# Patient Record
Sex: Male | Born: 1950
Health system: Southern US, Community
[De-identification: ages and names within clinical notes are randomized; demographics above are authoritative.]

## PROBLEM LIST (undated history)

## (undated) DIAGNOSIS — I10 Essential (primary) hypertension: Secondary | ICD-10-CM

## (undated) DIAGNOSIS — M1990 Unspecified osteoarthritis, unspecified site: Secondary | ICD-10-CM

## (undated) DIAGNOSIS — R112 Nausea with vomiting, unspecified: Secondary | ICD-10-CM

## (undated) DIAGNOSIS — K219 Gastro-esophageal reflux disease without esophagitis: Secondary | ICD-10-CM

## (undated) DIAGNOSIS — Z9889 Other specified postprocedural states: Secondary | ICD-10-CM

## (undated) DIAGNOSIS — N200 Calculus of kidney: Secondary | ICD-10-CM

## (undated) DIAGNOSIS — E7800 Pure hypercholesterolemia, unspecified: Secondary | ICD-10-CM

## (undated) DIAGNOSIS — I2510 Atherosclerotic heart disease of native coronary artery without angina pectoris: Secondary | ICD-10-CM

## (undated) DIAGNOSIS — C801 Malignant (primary) neoplasm, unspecified: Secondary | ICD-10-CM

## (undated) DIAGNOSIS — Z8719 Personal history of other diseases of the digestive system: Secondary | ICD-10-CM

## (undated) HISTORY — PX: OTHER SURGICAL HISTORY: SHX169

## (undated) HISTORY — PX: LITHOTRIPSY: SUR834

## (undated) HISTORY — PX: CORONARY ANGIOPLASTY: SHX604

## (undated) HISTORY — PX: CARDIAC CATHETERIZATION: SHX172

## (undated) HISTORY — PX: HERNIA REPAIR: SHX51

---

## 1972-02-23 HISTORY — PX: FRACTURE SURGERY: SHX138

## 1997-07-08 ENCOUNTER — Observation Stay (HOSPITAL_COMMUNITY): Admission: AD | Admit: 1997-07-08 | Discharge: 1997-07-09 | Payer: Self-pay | Admitting: Cardiology

## 2001-05-18 ENCOUNTER — Ambulatory Visit (HOSPITAL_BASED_OUTPATIENT_CLINIC_OR_DEPARTMENT_OTHER): Admission: RE | Admit: 2001-05-18 | Discharge: 2001-05-18 | Payer: Self-pay | Admitting: Urology

## 2001-05-18 ENCOUNTER — Encounter: Payer: Self-pay | Admitting: Urology

## 2001-07-03 ENCOUNTER — Ambulatory Visit (HOSPITAL_BASED_OUTPATIENT_CLINIC_OR_DEPARTMENT_OTHER): Admission: RE | Admit: 2001-07-03 | Discharge: 2001-07-03 | Payer: Self-pay | Admitting: Urology

## 2004-02-19 ENCOUNTER — Encounter: Payer: Self-pay | Admitting: Cardiology

## 2005-06-17 ENCOUNTER — Encounter: Payer: Self-pay | Admitting: Cardiology

## 2010-01-19 ENCOUNTER — Ambulatory Visit: Payer: Self-pay | Admitting: Cardiology

## 2010-07-10 NOTE — Op Note (Signed)
[facility]  Patient:    Lucas Buckley, Lucas Buckley Visit Number: [id number] MRN: [id number]          Service Type: NES Location: [facility] Attending Physician:  Trisha Mangle Dictated by:   Veverly Fells Vernie Ammons, M.D. Proc. Date: 07/03/01 Admit Date:  07/03/2001                             Operative Report  PREOPERATIVE DIAGNOSIS:  Left distal ureteral calculus.  POSTOPERATIVE DIAGNOSIS:  Left distal ureteral calculus.  PROCEDURE:  Cystoscopy, left ureteroscopy, and laser lithotripsy and stone extraction with double-J stent.  SURGEON:  Mark C. Vernie Ammons, M.D.  ANESTHESIA:  General.  DRAINS:  4.5 French, 26 cm double-J stent in left ureter.  SPECIMENS:  Stone to patient.  ESTIMATED BLOOD LOSS:  Approximately 10 cc.  COMPLICATIONS:  None.  INDICATIONS:  The patient is a 60 year old white male, who had a large distal ureteral stone, measuring 14 x 7 mm.  He underwent lithotripsy of the stone and had no significant fragmentation.  He therefore is brought to the OR today for ureteroscopic extraction of the stone.  He understands the risks, complications, alternatives, and limitations.  DESCRIPTION OF PROCEDURE:  After informed consent, the patient brought to the major OR, placed on the table, administered general anesthesia, then moved to the dorsal lithotomy position.  His genitalia was sterilely prepped and draped, and 6 French rigid ureteroscope was then passed per urethra into the bladder.  The bladder was noted to free of any tumor, stones, or inflammatory lesions on brief exam.  The left ureteral orifice was identified, and the scope was advanced into the orifice without difficulty and the stone identified and photographed.  I then felt because of the stones large size, it needed to be fragmented first and used the holmium laser to fragment the stone at a rate of 8 hertz and a power of .8 joules.  The stone was found to be extremely hard and fragmented  slowly but eventually did break into multiple pieces.  I then attempted to extract the pieces but noted some resistance, so I left the pieces in place, passed a guidewire through the scope and then used the ureteral dilating balloon and dilated the ureter to 18 atmospheres for two minutes.  Then I was able to reinsert the ureteroscope and extract multiple fragments.  This left only just a few small fragments present, and I therefore reinserted the ureteroscope, checked the ureter and found no other large fragm,ents and then passed the guidewire through the ureteroscope and removed the ureteroscope and backloaded the cystoscope and passed the double-J stent. Good curl was noted in the bladder and kidney, and the bladder was then drained, and the patient was awakened and taken to the recovery room in stable and satisfactory condition.  He will be given a prescription for Tylox for pain, #20, and follow up in my office in one week for stent removal.  A string was left affixed to the end of the stent. Dictated by:   Veverly Fells Vernie Ammons, M.D. Attending Physician:  Trisha Mangle DD:  07/03/01 TD:  07/03/01 Job: [id number]

## 2011-01-19 ENCOUNTER — Telehealth: Payer: Self-pay | Admitting: Cardiology

## 2011-01-19 NOTE — Telephone Encounter (Signed)
Pt to have a biopsy, pt needs to be off asa for five days prior

## 2011-01-19 NOTE — Telephone Encounter (Signed)
Left message

## 2011-01-19 NOTE — Telephone Encounter (Signed)
Is this ok?

## 2011-01-19 NOTE — Telephone Encounter (Signed)
Okay to hold the aspirin 5 days

## 2011-01-20 ENCOUNTER — Telehealth: Payer: Self-pay | Admitting: Cardiology

## 2011-01-20 DIAGNOSIS — E7800 Pure hypercholesterolemia, unspecified: Secondary | ICD-10-CM

## 2011-01-20 NOTE — Telephone Encounter (Signed)
Advised ok  

## 2011-01-20 NOTE — Telephone Encounter (Signed)
New Msg: Pt calling to schedule appt w/ Dr. Patty Sermons and wanted to know if and when pt needed to have blood work prior to pt appt. Please return pt call to discuss further.

## 2011-01-20 NOTE — Telephone Encounter (Signed)
Scheduled for labs and orders put in system

## 2011-01-25 ENCOUNTER — Other Ambulatory Visit (INDEPENDENT_AMBULATORY_CARE_PROVIDER_SITE_OTHER): Payer: 59 | Admitting: *Deleted

## 2011-01-25 DIAGNOSIS — E7800 Pure hypercholesterolemia, unspecified: Secondary | ICD-10-CM

## 2011-01-25 LAB — BASIC METABOLIC PANEL
BUN: 14 mg/dL (ref 6–23)
CO2: 25 mEq/L (ref 19–32)
Calcium: 9.1 mg/dL (ref 8.4–10.5)
Chloride: 105 mEq/L (ref 96–112)
Creatinine, Ser: 1 mg/dL (ref 0.4–1.5)
GFR: 83.62 mL/min (ref >60.00)
Glucose, Bld: 111 mg/dL — ABNORMAL HIGH (ref 70–99)
Potassium: 4 mEq/L (ref 3.5–5.1)
Sodium: 139 mEq/L (ref 135–145)

## 2011-01-25 LAB — LIPID PANEL
Cholesterol: 155 mg/dL (ref 0–200)
HDL: 42.4 mg/dL (ref >39.00)
LDL Cholesterol: 98 mg/dL (ref 0–99)
Total CHOL/HDL Ratio: 4
Triglycerides: 74 mg/dL (ref 0.0–149.0)
VLDL: 14.8 mg/dL (ref 0.0–40.0)

## 2011-01-25 LAB — HEPATIC FUNCTION PANEL
ALT: 32 U/L (ref 0–53)
AST: 38 U/L — ABNORMAL HIGH (ref 0–37)
Albumin: 4.2 g/dL (ref 3.5–5.2)
Alkaline Phosphatase: 46 U/L (ref 39–117)
Bilirubin, Direct: 0.2 mg/dL (ref 0.0–0.3)
Total Bilirubin: 1.6 mg/dL — ABNORMAL HIGH (ref 0.3–1.2)
Total Protein: 7.5 g/dL (ref 6.0–8.3)

## 2011-01-27 ENCOUNTER — Ambulatory Visit (INDEPENDENT_AMBULATORY_CARE_PROVIDER_SITE_OTHER): Payer: 59 | Admitting: Cardiology

## 2011-01-27 ENCOUNTER — Encounter: Payer: Self-pay | Admitting: Cardiology

## 2011-01-27 VITALS — BP 138/80 | HR 60 | Ht 73.0 in | Wt 259.0 lb

## 2011-01-27 DIAGNOSIS — I259 Chronic ischemic heart disease, unspecified: Secondary | ICD-10-CM

## 2011-01-27 DIAGNOSIS — I421 Obstructive hypertrophic cardiomyopathy: Secondary | ICD-10-CM

## 2011-01-27 DIAGNOSIS — E7800 Pure hypercholesterolemia, unspecified: Secondary | ICD-10-CM | POA: Insufficient documentation

## 2011-01-27 NOTE — Patient Instructions (Signed)
Your physician recommends that you continue on your current medications as directed. Please refer to the Current Medication list given to you today.  Your physician wants you to follow-up in: 6 months. You will receive a reminder letter in the mail two months in advance. If you don't receive a letter, please call our office to schedule the follow-up appointment.  

## 2011-01-27 NOTE — Assessment & Plan Note (Signed)
His lipids are satisfactory on current Crestor therapy.  As lost 10 pounds since last visit.  We will try to get his LDL below her to continued careful diet and continue Crestor

## 2011-01-27 NOTE — Assessment & Plan Note (Signed)
The patient is having no symptoms related to his IHSS.  No dizziness or syncope chest pain or dyspnea

## 2011-01-27 NOTE — Assessment & Plan Note (Signed)
Patient goes to the gymnasium on a regular basis.  He has not been experiencing any exertional chest pain.  His electrocardiogram today shows improvement since 2003.

## 2011-01-27 NOTE — Progress Notes (Signed)
Lucas Buckley Date of Birth:  [birth year] [facility] [address]   [address] [place], [place]  [address] [phone]           Fax   [phone]  History of Present Illness: This pleasant 60 year old gentleman is seen for a scheduled followup office visit.  He has a known history of ischemic heart disease.  In 1999 he had stents to his right coronary artery and circumflex.  Dr. [place] performed the catheterization.  The patient has not had any recurrent chest pain.  He had a normal 2 day stress Cardiolite study in 2005.  Ejection fraction was 56%.  The patient has a history of IHSS.  He also has a history of hypercholesterolemia.  Recently had an abnormal rectal exam by his urologist and is scheduled to have a transrectal biopsy in next week.  The patient has a history of probable bursitis of the shoulders and has seen Dr. Darrelyn Hillock.  Current Outpatient Prescriptions  Medication Sig Dispense Refill  . ascorbic acid (VITAMIN C) 500 MG tablet Take 500 mg by mouth daily.        Marland Kitchen aspirin 325 MG tablet Take 325 mg by mouth daily.        Marland Kitchen GARLIC PO Take by mouth daily.        . metoprolol (TOPROL-XL) 100 MG 24 hr tablet Take 100 mg by mouth daily.        . Omega-3 Fatty Acids (FISH OIL PO) Take by mouth. Taking 1000 daily       . rosuvastatin (CRESTOR) 10 MG tablet Take 10 mg by mouth daily.        . Saw Palmetto, Serenoa repens, (SAW PALMETTO PO) Take by mouth.          Not on File  There is no problem list on file for this patient.   History  Smoking status  . Never Smoker   Smokeless tobacco  . Not on file    History  Alcohol Use: Not on file    No family history on file.  Review of Systems: Constitutional: no fever chills diaphoresis or fatigue or change in weight.  Head and neck: no hearing loss, no epistaxis, no photophobia or visual disturbance. Respiratory: No cough, shortness of breath or wheezing. Cardiovascular: No chest pain peripheral  edema, palpitations. Gastrointestinal: No abdominal distention, no abdominal pain, no change in bowel habits hematochezia or melena. Genitourinary: No dysuria, no frequency, no urgency, no nocturia. Musculoskeletal:No arthralgias, no back pain, no gait disturbance or myalgias. Neurological: No dizziness, no headaches, no numbness, no seizures, no syncope, no weakness, no tremors. Hematologic: No lymphadenopathy, no easy bruising. Psychiatric: No confusion, no hallucinations, no sleep disturbance.    Physical Exam: Filed Vitals:   01/27/11 1328  BP: 138/80  Pulse: 60   the general appearance reveals a well-developed well-nourished gentleman in no distress.Pupils equal and reactive.   Extraocular Movements are full.  There is no scleral icterus.  The mouth and pharynx are normal.  The neck is supple.  The carotids reveal no bruits.  The jugular venous pressure is normal.  The thyroid is not enlarged.  There is no lymphadenopathy.  The chest is clear to percussion and auscultation. There are no rales or rhonchi. Expansion of the chest is symmetrical.  Heart reveals a soft systolic ejection murmur at the base.The abdomen is soft and nontender. Bowel sounds are normal. The liver and spleen are not enlarged. There Are no  abdominal masses. There are no bruits.  Extremities no phlebitis or edema.  Normal pedal pulsesThe skin is warm and dry.  There is no rash. Strength is normal and symmetrical in all extremities.  There is no lateralizing weakness.  There are no sensory deficits.  EKG shows sinus bradycardia and minor nonspecific T wave abnormalities, improved since 2003.   Assessment / Plan:  Continue same medication.  He will be having a prostate biopsy next week.  Recheck in 6 months for followup office visit lipid panel and hepatic function panel and basal metabolic panel

## 2011-02-19 ENCOUNTER — Other Ambulatory Visit: Payer: Self-pay | Admitting: *Deleted

## 2011-02-19 MED ORDER — METOPROLOL SUCCINATE ER 100 MG PO TB24: 100.0000 mg | ORAL_TABLET | Freq: Every day | ORAL | Status: DC

## 2011-03-01 ENCOUNTER — Other Ambulatory Visit: Payer: Self-pay | Admitting: *Deleted

## 2011-03-01 MED ORDER — METOPROLOL SUCCINATE ER 100 MG PO TB24: 100.0000 mg | ORAL_TABLET | Freq: Every day | ORAL | Status: DC

## 2011-03-01 NOTE — Telephone Encounter (Signed)
Refilled metoprolol 

## 2011-03-12 ENCOUNTER — Other Ambulatory Visit: Payer: Self-pay | Admitting: Urology

## 2011-03-15 ENCOUNTER — Other Ambulatory Visit: Payer: Self-pay | Admitting: *Deleted

## 2011-03-15 MED ORDER — ROSUVASTATIN CALCIUM 10 MG PO TABS: 10.0000 mg | ORAL_TABLET | Freq: Every day | ORAL | Status: DC

## 2011-03-15 NOTE — Telephone Encounter (Signed)
Refilled crestor 

## 2011-03-16 ENCOUNTER — Encounter (HOSPITAL_COMMUNITY): Payer: Self-pay | Admitting: Pharmacy Technician

## 2011-03-23 ENCOUNTER — Encounter (HOSPITAL_COMMUNITY): Payer: Self-pay

## 2011-03-23 ENCOUNTER — Encounter (HOSPITAL_COMMUNITY)
Admission: RE | Admit: 2011-03-23 | Discharge: 2011-03-23 | Disposition: A | Discharge status: Home / Self Care | Payer: 59 | Source: Ambulatory Visit | Attending: Urology | Admitting: Urology

## 2011-03-23 ENCOUNTER — Ambulatory Visit (HOSPITAL_COMMUNITY)
Admission: RE | Admit: 2011-03-23 | Discharge: 2011-03-23 | Disposition: A | Discharge status: Home / Self Care | Payer: 59 | Source: Ambulatory Visit | Attending: Urology | Admitting: Urology

## 2011-03-23 DIAGNOSIS — Z01818 Encounter for other preprocedural examination: Secondary | ICD-10-CM | POA: Insufficient documentation

## 2011-03-23 DIAGNOSIS — I259 Chronic ischemic heart disease, unspecified: Secondary | ICD-10-CM | POA: Insufficient documentation

## 2011-03-23 DIAGNOSIS — Z01812 Encounter for preprocedural laboratory examination: Secondary | ICD-10-CM | POA: Insufficient documentation

## 2011-03-23 HISTORY — DX: Gastro-esophageal reflux disease without esophagitis: K21.9

## 2011-03-23 HISTORY — DX: Other specified postprocedural states: R11.2

## 2011-03-23 HISTORY — DX: Other specified postprocedural states: Z98.890

## 2011-03-23 HISTORY — DX: Unspecified osteoarthritis, unspecified site: M19.90

## 2011-03-23 HISTORY — DX: Atherosclerotic heart disease of native coronary artery without angina pectoris: I25.10

## 2011-03-23 HISTORY — DX: Pure hypercholesterolemia, unspecified: E78.00

## 2011-03-23 HISTORY — DX: Malignant (primary) neoplasm, unspecified: C80.1

## 2011-03-23 LAB — BASIC METABOLIC PANEL
CO2: 27 mEq/L (ref 19–32)
Chloride: 104 mEq/L (ref 96–112)
GFR calc Af Amer: >90 mL/min (ref >90)
Potassium: 4.8 mEq/L (ref 3.5–5.1)
Sodium: 140 mEq/L (ref 135–145)

## 2011-03-23 LAB — SURGICAL PCR SCREEN: Staphylococcus aureus: NEGATIVE

## 2011-03-23 LAB — CBC
Platelets: 174 10*3/uL (ref 150–400)
RBC: 4.96 MIL/uL (ref 4.22–5.81)
RDW: 12.9 % (ref 11.5–15.5)
WBC: 6.8 10*3/uL (ref 4.0–10.5)

## 2011-03-23 NOTE — Pre-Procedure Instructions (Signed)
States has bowel prep and instructions.   PER DR [name] OFFICE NOTE----LAST STRESS TEST 2005

## 2011-03-23 NOTE — Patient Instructions (Signed)
20 Lucas Buckley  03/23/2011   Your procedure is scheduled on:  03/29/11  Surgery   [id number]    Monday  Report to [facility] at 1215PM.  Call this number if you have problems the morning of surgery: [phone]       [facility]  PST [id number]   Remember:   Do not eat food:After Midnight. Saturday night  May have clear liquids:up to 6 Hours before arrival.until 0615 am  Monday       Increase fluids Sunday and drink until Monday AM at 0615  Clear liquids include soda, tea, black coffee, apple or grape juice, broth.  Take these medicines the morning of surgery with A SIP OF WATER: Metoprolol with sip water   Do not wear jewelry, make-up or nail polish.  Do not wear lotions, powders, or perfumes. You may wear deodorant.  Do not shave 48 hours prior to surgery.  Do not bring valuables to the hospital.  Contacts, dentures or bridgework may not be worn into surgery.  Leave suitcase in the car. After surgery it may be brought to your room.  For patients admitted to the hospital, checkout time is 11:00 AM the day of discharge.   Patients discharged the day of surgery will not be allowed to drive home.  Name and phone number of your driver: wife Special Instructions: CHG Shower Use Special Wash: 1/2 bottle night before surgery and 1/2 bottle morning of surgery.  Regular soap face and privates   Please read over the following fact sheets that you were given: MRSA Information

## 2011-03-26 NOTE — Pre-Procedure Instructions (Signed)
Left voice mail message of surgery time change on Monday at home and cell- instructed to appive at short stay at 1300

## 2011-03-28 NOTE — H&P (Signed)
Chief Complaint  Prostate Cancer   Reason For Visit  Reason for consult: To discuss treatment options for prostate cancer and specifically to consider a robotic prostatectomy. Physician requesting consult: Dr. Ihor Gully PCP: Dr. Elias Else Cardiologist: Dr. Cassell Clement   History of Present Illness  Lucas Buckley is a 61 year old who was found to have a new left apical prostate nodule by Dr. Vernie Ammons prompting a prostate biopsy on 01/23/11 which revealed Gleason 4+3=7 adenocarcinoma with 4 out of 12 biopsy cores positive for malignancy. His last PSA was 2.47 prior to his biopsy. He has no family history of prostate cancer.  He is very well informed about his treatment options and is most interested in proceeding with surgical therapy.  TNM stage: cT2a Nx Mx (L apex) PSA: 2.47 Gleason score: 4+3=7 Biopsy (02/02/11): 4/12 cores -- L lateral apex (5%, 3+3=6), L mid (60%, 4+3=7), L lateral base (20%, 4+3=7), L base (60%, 4+3=7, PNI) Prostate volume: 42.2 cc  Nomogram: OC disease: 75% EPE: 33% SVI: 1% LNI: 2% PFS (surgery): 94%, 92%  Urinary function: He has minimal voiding symptoms. IPSS: One. He does have some postvoid dribbling which is his most bothersome symptom. Erectile function: He does have mild erectile dysfunction which has been adequately treated with Levitra 10 mg. SHIM score without Levitra is 17.   Past Medical History Problems  1. History of  Coronary Artery Disease V12.59 2. History of  Idiopathic Hypertrophic Subaortic Stenosis 425.11 3. History of  Nephrolithiasis V13.01 4. History of  Testicle Torsion 608.20  Surgical History Problems  1. History of  Biopsy Of The Prostate Needle 2. History of  Cystoscopy With Ureteroscopy With Lithotripsy Left 3. History of  Leg Repair 4. History of  Lithotripsy Left 5. History of  Lithotripsy Left 6. History of  Surgery Testis Orchiopexy Bilateral 7. History of  Umbilical Hernia Repair  Current Meds 1. Aspirin TABS;  325mg ; 1 per day; Therapy: (Recorded:27Nov2012) to 2. Crestor TABS; Therapy: (Recorded:27Nov2012) to 3. Levitra 10 MG Oral Tablet; Take 1 tablet as needed; Therapy: 12Mar2010 to (Last Rx:27Nov2012)   Requested for: 27Nov2012 4. Metoprolol Succinate ER TB24; Therapy: (Recorded:27Nov2012) to  Allergies Medication  1. No Known Drug Allergies  Family History Problems  1. Paternal history of  Death In The Family Father age 41; heart problems Denied  2. Family history of  Prostate Cancer  Social History Problems  1. Activities Of Daily Living 2. Exercise Habits He was regularly visiting the wife for exercise doing primarily cardio and light weights at least 3-4 times a week and has had decreased his exercise regimen but is hoping to return to this level of activity. 3. Living Independently With Spouse 4. Marital History - Currently Married 5. Never A Smoker 6. Occupation: minister 7. Self-reliant In Usual Daily Activities Denied  8. History of  Alcohol Use 9. History of  Tobacco Use  Review of Systems Constitutional, skin, eye, otolaryngeal, hematologic/lymphatic, cardiovascular, pulmonary, endocrine, musculoskeletal, gastrointestinal, neurological and psychiatric system(s) were reviewed and pertinent findings if present are noted.  Cardiovascular: no chest pain and no leg swelling.  Respiratory: no shortness of breath and no cough.    Vitals Vital Signs [Data Includes: Last 1 Day]  29Jan2013 08:14AM  BMI Calculated: 34.46 BSA Calculated: 2.4 Height: 6 ft 1 in Weight: 260 lb  Blood Pressure: 163 / 80 Temperature: 97.6 F Heart Rate: 55  Physical Exam Constitutional: Well nourished and well developed . No acute distress.  ENT:. The ears and  nose are normal in appearance.  Neck: The appearance of the neck is normal and no neck mass is present.  Pulmonary: No respiratory distress, normal respiratory rhythm and effort and clear bilateral breath sounds.  Cardiovascular: Heart  rate and rhythm are normal . No peripheral edema.  Abdomen: The abdomen is rounded. The abdomen is soft and nontender. No masses are palpated. No CVA tenderness. No hernias are palpable. No hepatosplenomegaly noted.  Rectal: He has a distinct nodule at the left apex which is prominent and concerning for malignancy. This does extend to but not necessarily be on the capsule near the left apex.  Lymphatics: The femoral and inguinal nodes are not enlarged or tender.  Skin: Normal skin turgor, no visible rash and no visible skin lesions.  Neuro/Psych:. Mood and affect are appropriate.    Results/Data Urine [Data Includes: Last 1 Day]   29Jan2013 COLOR YELLOW  APPEARANCE CLEAR  SPECIFIC GRAVITY 1.025  pH 5.0  GLUCOSE NEG mg/dL BILIRUBIN NEG  KETONE NEG mg/dL BLOOD SMALL  PROTEIN NEG mg/dL UROBILINOGEN 0.2 mg/dL NITRITE NEG  LEUKOCYTE ESTERASE NEG  SQUAMOUS EPITHELIAL/HPF RARE  WBC 0-3 WBC/hpf RBC 0-3 RBC/hpf BACTERIA NONE SEEN  CRYSTALS NONE SEEN  CASTS NONE SEEN    I have independently reviewed his medical records, PSA results, and pathology report with findings as dictated above.  He also has been recently evaluated by Dr. Patty Sermons who has indicated that his IHSS is stable as well as his history of ischemic heart disease. He feels that he would be at low risk for proceeding with surgical treatment of his prostate cancer.   Assessment Assessed  1. Adenocarcinoma Of The Prostate Gland 185  Plan Adenocarcinoma Of The Prostate Gland (185)  1. Follow-up Office  Follow-up  Requested for: 29Jan2013 Health Maintenance (V70.0)  2. UA With REFLEX  Done: 29Jan2013 08:09AM  Discussion/Summary  1. Prostate cancer:   The patient was counseled about the natural history of prostate cancer and the standard treatment options that are available for prostate cancer. It was explained to him how his age and life expectancy, clinical stage, Gleason score, and PSA affect his prognosis, the  decision to proceed with additional staging studies, as well as how that information influences recommended treatment strategies. We discussed the roles for active surveillance, radiation therapy, surgical therapy, androgen deprivation, as well as ablative therapy options for the treatment of prostate cancer as appropriate to his individual cancer situation. We discussed the risks and benefits of these options with regard to their impact on cancer control and also in terms of potential adverse events, complications, and impact on quiality of life particularly related to urinary, bowel, and sexual function. The patient was encouraged to ask questions throughout the discussion today and all questions were answered to his stated satisfaction. In addition, the patient was provided with and/or directed to appropriate resources and literature for further education about prostate cancer and treatment options.   We discussed surgical therapy for prostate cancer including the different available surgical approaches. We discussed, in detail, the risks and expectations of surgery with regard to cancer control, urinary control, and erectile function as well as the expected postoperative recovery process. Additional risks of surgery including but not limited to bleeding, infection, hernia formation, nerve damage, lymphocele formation, bowel/rectal injury potentially necessitating colostomy, damage to the urinary tract resulting in urine leakage, urethral stricture, and the cardiopulmonary risks such as myocardial infarction, stroke, death, venothromboembolism, etc. were explained. The risk of open surgical conversion for robotic/laparoscopic  prostatectomy was also discussed.     He has been offered a radiation oncology consultation but does wish to proceed with surgical treatment of his prostate cancer. We have discussed proceeding with a right nerve sparing robotic prostatectomy and pelvic lymphadenectomy. He understands the  potential impact of a unilateral nerve sparing on his ultimate erectile function outcome. He is felt to be at low risk from a cardiac standpoint.  Cc: Dr. Elias Else Dr. Cassell Clement Dr. Ihor Gully    SignaturesElectronically signed by : Heloise Purpura, M.D.; Mar 23 2011  9:22AM

## 2011-03-29 ENCOUNTER — Encounter (HOSPITAL_COMMUNITY): Payer: Self-pay | Admitting: Certified Registered Nurse Anesthetist

## 2011-03-29 ENCOUNTER — Other Ambulatory Visit: Payer: Self-pay | Admitting: Urology

## 2011-03-29 ENCOUNTER — Inpatient Hospital Stay (HOSPITAL_COMMUNITY)
Admission: RE | Admit: 2011-03-29 | Discharge: 2011-03-30 | DRG: 708 | Disposition: A | Discharge status: Home / Self Care | Payer: 59 | Source: Ambulatory Visit | Attending: Urology | Admitting: Urology

## 2011-03-29 ENCOUNTER — Inpatient Hospital Stay (HOSPITAL_COMMUNITY): Payer: 59 | Admitting: Certified Registered Nurse Anesthetist

## 2011-03-29 ENCOUNTER — Encounter (HOSPITAL_COMMUNITY): Payer: Self-pay | Admitting: *Deleted

## 2011-03-29 ENCOUNTER — Encounter (HOSPITAL_COMMUNITY)
Admission: RE | Disposition: A | Discharge status: Home / Self Care | Payer: Self-pay | Source: Ambulatory Visit | Attending: Urology

## 2011-03-29 DIAGNOSIS — I2510 Atherosclerotic heart disease of native coronary artery without angina pectoris: Secondary | ICD-10-CM | POA: Diagnosis present

## 2011-03-29 DIAGNOSIS — Z9861 Coronary angioplasty status: Secondary | ICD-10-CM

## 2011-03-29 DIAGNOSIS — C61 Malignant neoplasm of prostate: Principal | ICD-10-CM | POA: Diagnosis present

## 2011-03-29 DIAGNOSIS — K219 Gastro-esophageal reflux disease without esophagitis: Secondary | ICD-10-CM | POA: Diagnosis present

## 2011-03-29 HISTORY — PX: ROBOT ASSISTED LAPAROSCOPIC RADICAL PROSTATECTOMY: SHX5141

## 2011-03-29 LAB — ABO/RH: ABO/RH(D): A POS

## 2011-03-29 LAB — TYPE AND SCREEN
ABO/RH(D): A POS
Antibody Screen: NEGATIVE

## 2011-03-29 SURGERY — ROBOTIC ASSISTED LAPAROSCOPIC RADICAL PROSTATECTOMY LEVEL 2
Anesthesia: General | Site: Pelvis | Wound class: Clean Contaminated

## 2011-03-29 MED ORDER — SODIUM CHLORIDE 0.9 % IV BOLUS (SEPSIS)
1000.0000 mL | Freq: Once | INTRAVENOUS | Status: AC
  Administered 2011-03-29: 1000 mL via INTRAVENOUS

## 2011-03-29 MED ORDER — ROSUVASTATIN CALCIUM 10 MG PO TABS
10.0000 mg | ORAL_TABLET | Freq: Every day | ORAL | Status: DC
  Administered 2011-03-30: 10 mg via ORAL
  Filled 2011-03-29: qty 1

## 2011-03-29 MED ORDER — DEXAMETHASONE SODIUM PHOSPHATE 10 MG/ML IJ SOLN
INTRAMUSCULAR | Status: DC | PRN
  Administered 2011-03-29: 10 mg via INTRAVENOUS

## 2011-03-29 MED ORDER — DIPHENHYDRAMINE HCL 50 MG/ML IJ SOLN: 12.5000 mg | Freq: Four times a day (QID) | INTRAMUSCULAR | Status: DC | PRN

## 2011-03-29 MED ORDER — METOPROLOL SUCCINATE ER 100 MG PO TB24
100.0000 mg | ORAL_TABLET | Freq: Every day | ORAL | Status: DC
  Administered 2011-03-30: 100 mg via ORAL
  Filled 2011-03-29: qty 1

## 2011-03-29 MED ORDER — DIPHENHYDRAMINE HCL 12.5 MG/5ML PO ELIX: 12.5000 mg | ORAL_SOLUTION | Freq: Four times a day (QID) | ORAL | Status: DC | PRN

## 2011-03-29 MED ORDER — LACTATED RINGERS IV SOLN
INTRAVENOUS | Status: DC | PRN
  Administered 2011-03-29: 13:00:00 via INTRAVENOUS

## 2011-03-29 MED ORDER — FENTANYL CITRATE 0.05 MG/ML IJ SOLN
INTRAMUSCULAR | Status: DC | PRN
  Administered 2011-03-29 (×8): 50 ug via INTRAVENOUS

## 2011-03-29 MED ORDER — LACTATED RINGERS IV SOLN
INTRAVENOUS | Status: DC | PRN
  Administered 2011-03-29: 1000 mL via INTRAVENOUS

## 2011-03-29 MED ORDER — PROMETHAZINE HCL 25 MG/ML IJ SOLN
6.2500 mg | INTRAMUSCULAR | Status: DC | PRN
  Administered 2011-03-29: 6.25 mg via INTRAVENOUS

## 2011-03-29 MED ORDER — KCL IN DEXTROSE-NACL 20-5-0.45 MEQ/L-%-% IV SOLN
INTRAVENOUS | Status: DC
  Administered 2011-03-29 – 2011-03-30 (×2): via INTRAVENOUS
  Filled 2011-03-29 (×4): qty 1000

## 2011-03-29 MED ORDER — SODIUM CHLORIDE 0.9 % IR SOLN
Status: DC | PRN
  Administered 2011-03-29: 250 mL

## 2011-03-29 MED ORDER — BUPIVACAINE-EPINEPHRINE 0.25% -1:200000 IJ SOLN
INTRAMUSCULAR | Status: DC | PRN
  Administered 2011-03-29: 30 mL

## 2011-03-29 MED ORDER — KETOROLAC TROMETHAMINE 15 MG/ML IJ SOLN
INTRAMUSCULAR | Status: AC
  Administered 2011-03-29: 15 mg via INTRAVENOUS
  Filled 2011-03-29: qty 1

## 2011-03-29 MED ORDER — KETOROLAC TROMETHAMINE 15 MG/ML IJ SOLN
15.0000 mg | Freq: Four times a day (QID) | INTRAMUSCULAR | Status: DC
  Administered 2011-03-29: 15 mg via INTRAVENOUS

## 2011-03-29 MED ORDER — HYDROMORPHONE HCL PF 1 MG/ML IJ SOLN
INTRAMUSCULAR | Status: DC | PRN
  Administered 2011-03-29 (×2): 0.5 mg via INTRAVENOUS

## 2011-03-29 MED ORDER — ROCURONIUM BROMIDE 100 MG/10ML IV SOLN
INTRAVENOUS | Status: DC | PRN
  Administered 2011-03-29: 50 mg via INTRAVENOUS
  Administered 2011-03-29: 5 mg via INTRAVENOUS
  Administered 2011-03-29: 20 mg via INTRAVENOUS

## 2011-03-29 MED ORDER — NEOSTIGMINE METHYLSULFATE 1 MG/ML IJ SOLN
INTRAMUSCULAR | Status: DC | PRN
  Administered 2011-03-29: 5 mg via INTRAVENOUS

## 2011-03-29 MED ORDER — MIDAZOLAM HCL 5 MG/5ML IJ SOLN
INTRAMUSCULAR | Status: DC | PRN
  Administered 2011-03-29: 2 mg via INTRAVENOUS

## 2011-03-29 MED ORDER — ONDANSETRON HCL 4 MG/2ML IJ SOLN
INTRAMUSCULAR | Status: DC | PRN
  Administered 2011-03-29: 4 mg via INTRAVENOUS

## 2011-03-29 MED ORDER — HYDROMORPHONE HCL PF 1 MG/ML IJ SOLN: 0.2500 mg | INTRAMUSCULAR | Status: DC | PRN

## 2011-03-29 MED ORDER — LIDOCAINE HCL (CARDIAC) 20 MG/ML IV SOLN
INTRAVENOUS | Status: DC | PRN
  Administered 2011-03-29: 100 mg via INTRAVENOUS

## 2011-03-29 MED ORDER — CEFAZOLIN SODIUM 1-5 GM-% IV SOLN
1.0000 g | Freq: Three times a day (TID) | INTRAVENOUS | Status: AC
  Administered 2011-03-29 – 2011-03-30 (×2): 1 g via INTRAVENOUS
  Filled 2011-03-29 (×2): qty 50

## 2011-03-29 MED ORDER — HEPARIN SODIUM (PORCINE) 1000 UNIT/ML IJ SOLN
INTRAMUSCULAR | Status: DC | PRN
  Administered 2011-03-29: 1000 [IU] via INTRAVENOUS

## 2011-03-29 MED ORDER — INDIGOTINDISULFONATE SODIUM 8 MG/ML IJ SOLN
INTRAMUSCULAR | Status: DC | PRN
  Administered 2011-03-29 (×2): 5 mL via INTRAVENOUS

## 2011-03-29 MED ORDER — HYDROCODONE-ACETAMINOPHEN 5-325 MG PO TABS: 1.0000 | ORAL_TABLET | Freq: Four times a day (QID) | ORAL | Status: DC | PRN

## 2011-03-29 MED ORDER — DOCUSATE SODIUM 100 MG PO CAPS
100.0000 mg | ORAL_CAPSULE | Freq: Two times a day (BID) | ORAL | Status: DC
  Administered 2011-03-29 – 2011-03-30 (×2): 100 mg via ORAL
  Filled 2011-03-29 (×3): qty 1

## 2011-03-29 MED ORDER — GLYCOPYRROLATE 0.2 MG/ML IJ SOLN
INTRAMUSCULAR | Status: DC | PRN
  Administered 2011-03-29: .8 mg via INTRAVENOUS

## 2011-03-29 MED ORDER — ACETAMINOPHEN 325 MG PO TABS
650.0000 mg | ORAL_TABLET | ORAL | Status: DC | PRN
  Administered 2011-03-29: 650 mg via ORAL
  Filled 2011-03-29: qty 2

## 2011-03-29 MED ORDER — STERILE WATER FOR IRRIGATION IR SOLN
Status: DC | PRN
  Administered 2011-03-29: 3000 mL

## 2011-03-29 MED ORDER — CEFAZOLIN SODIUM-DEXTROSE 2-3 GM-% IV SOLR
2.0000 g | Freq: Once | INTRAVENOUS | Status: AC
  Administered 2011-03-29: 2 g via INTRAVENOUS

## 2011-03-29 MED ORDER — CIPROFLOXACIN HCL 500 MG PO TABS: 500.0000 mg | ORAL_TABLET | Freq: Two times a day (BID) | ORAL | Status: DC

## 2011-03-29 MED ORDER — KETOROLAC TROMETHAMINE 15 MG/ML IJ SOLN: 15.0000 mg | Freq: Four times a day (QID) | INTRAMUSCULAR | Status: DC

## 2011-03-29 MED ORDER — PROPOFOL 10 MG/ML IV EMUL
INTRAVENOUS | Status: DC | PRN
  Administered 2011-03-29: 150 mg via INTRAVENOUS

## 2011-03-29 MED ORDER — ACETAMINOPHEN 10 MG/ML IV SOLN
INTRAVENOUS | Status: DC | PRN
  Administered 2011-03-29: 1000 mg via INTRAVENOUS

## 2011-03-29 MED ORDER — KETOROLAC TROMETHAMINE 30 MG/ML IJ SOLN: 15.0000 mg | Freq: Once | INTRAMUSCULAR | Status: DC | PRN

## 2011-03-29 MED ORDER — MORPHINE SULFATE 2 MG/ML IJ SOLN
2.0000 mg | INTRAMUSCULAR | Status: DC | PRN
  Administered 2011-03-30 (×2): 2 mg via INTRAVENOUS
  Filled 2011-03-29 (×2): qty 1

## 2011-03-29 SURGICAL SUPPLY — 35 items
CANISTER SUCTION 2500CC (MISCELLANEOUS) ×3 IMPLANT
CATH ROBINSON RED A/P 8FR (CATHETERS) ×3 IMPLANT
CHLORAPREP W/TINT 26ML (MISCELLANEOUS) ×3 IMPLANT
CLIP LIGATING HEM O LOK PURPLE (MISCELLANEOUS) ×6 IMPLANT
CLOTH BEACON ORANGE TIMEOUT ST (SAFETY) ×3 IMPLANT
CORD HIGH FREQUENCY UNIPOLAR (ELECTROSURGICAL) ×3 IMPLANT
COVER SURGICAL LIGHT HANDLE (MISCELLANEOUS) ×3 IMPLANT
COVER TIP SHEARS 8 DVNC (MISCELLANEOUS) ×2 IMPLANT
COVER TIP SHEARS 8MM DA VINCI (MISCELLANEOUS) ×1
CUTTER ECHEON FLEX ENDO 45 340 (ENDOMECHANICALS) ×3 IMPLANT
DECANTER SPIKE VIAL GLASS SM (MISCELLANEOUS) ×2 IMPLANT
DRAPE SURG IRRIG POUCH 19X23 (DRAPES) ×3 IMPLANT
DRSG TEGADERM 6X8 (GAUZE/BANDAGES/DRESSINGS) ×6 IMPLANT
ELECT REM PT RETURN 9FT ADLT (ELECTROSURGICAL) ×3
ELECTRODE REM PT RTRN 9FT ADLT (ELECTROSURGICAL) ×2 IMPLANT
GLOVE BIO SURGEON STRL SZ 6.5 (GLOVE) ×3 IMPLANT
GLOVE BIOGEL M STRL SZ7.5 (GLOVE) ×6 IMPLANT
GOWN STRL NON-REIN LRG LVL3 (GOWN DISPOSABLE) ×9 IMPLANT
GOWN STRL REIN XL XLG (GOWN DISPOSABLE) ×6 IMPLANT
HOLDER FOLEY CATH W/STRAP (MISCELLANEOUS) ×3 IMPLANT
IV LACTATED RINGERS 1000ML (IV SOLUTION) ×2 IMPLANT
KIT ACCESSORY DA VINCI DISP (KITS) ×1
KIT ACCESSORY DVNC DISP (KITS) ×2 IMPLANT
NDL SAFETY ECLIPSE 18X1.5 (NEEDLE) ×2 IMPLANT
NEEDLE HYPO 18GX1.5 SHARP (NEEDLE) ×3
PACK ROBOT UROLOGY CUSTOM (CUSTOM PROCEDURE TRAY) ×3 IMPLANT
RELOAD GREEN ECHELON 45 (STAPLE) ×3 IMPLANT
SET TUBE IRRIG SUCTION NO TIP (IRRIGATION / IRRIGATOR) ×3 IMPLANT
SOLUTION ELECTROLUBE (MISCELLANEOUS) ×3 IMPLANT
SPONGE GAUZE 4X4 12PLY (GAUZE/BANDAGES/DRESSINGS) ×3 IMPLANT
SUT VICRYL 0 UR6 27IN ABS (SUTURE) ×6 IMPLANT
SYR 27GX1/2 1ML LL SAFETY (SYRINGE) ×3 IMPLANT
TOWEL OR 17X26 10 PK STRL BLUE (TOWEL DISPOSABLE) ×3 IMPLANT
TOWEL OR NON WOVEN STRL DISP B (DISPOSABLE) ×3 IMPLANT
WATER STERILE IRR 1500ML POUR (IV SOLUTION) ×6 IMPLANT

## 2011-03-29 NOTE — Anesthesia Preprocedure Evaluation (Addendum)
Anesthesia Evaluation  Patient identified by MRN, date of birth, ID band Patient awake    Reviewed: Allergy & Precautions, H&P , NPO status , Patient's Chart, lab work & pertinent test results  Airway Mallampati: III TM Distance: <3 FB Neck ROM: Full    Dental No notable dental hx.    Pulmonary neg pulmonary ROS,  clear to auscultation  Pulmonary exam normal       Cardiovascular + CAD and + Cardiac Stents Regular Normal    Neuro/Psych Negative Neurological ROS  Negative Psych ROS   GI/Hepatic Neg liver ROS, GERD-  ,  Endo/Other  Morbid obesity  Renal/GU negative Renal ROS  Genitourinary negative   Musculoskeletal negative musculoskeletal ROS (+)   Abdominal   Peds negative pediatric ROS (+)  Hematology negative hematology ROS (+)   Anesthesia Other Findings   Reproductive/Obstetrics negative OB ROS                           Anesthesia Physical Anesthesia Plan  ASA: III  Anesthesia Plan: General   Post-op Pain Management:    Induction: Intravenous  Airway Management Planned: Oral ETT  Additional Equipment:   Intra-op Plan:   Post-operative Plan: Extubation in OR  Informed Consent: I have reviewed the patients History and Physical, chart, labs and discussed the procedure including the risks, benefits and alternatives for the proposed anesthesia with the patient or authorized representative who has indicated his/her understanding and acceptance.   Dental advisory given  Plan Discussed with: CRNA  Anesthesia Plan Comments:         Anesthesia Quick Evaluation

## 2011-03-29 NOTE — Interval H&P Note (Signed)
History and Physical Interval Note:  03/29/2011 11:59 AM  Lucas Buckley  has presented today for surgery, with the diagnosis of Prostate Cancer  The various methods of treatment have been discussed with the patient and family. After consideration of risks, benefits and other options for treatment, the patient has consented to  Procedure(s): ROBOTIC ASSISTED LAPAROSCOPIC RADICAL PROSTATECTOMY LEVEL 2 LYMPHADENECTOMY as a surgical intervention .  The patients' history has been reviewed, patient examined, no change in status, stable for surgery.  I have reviewed the patients' chart and labs.  Questions were answered to the patient's satisfaction.     Tyreese Thain,LES

## 2011-03-29 NOTE — Progress Notes (Signed)
States compliant with bowel prep as directed by doctor. 

## 2011-03-29 NOTE — Progress Notes (Signed)
Pharmacy may adjust antibiotics for renal function  Ancef ordered x 2 post-op Clearance > 75 ml/min  No adjustment necessary.  Otho Bellows PharmD 7:03 PM

## 2011-03-29 NOTE — Op Note (Signed)
Preoperative diagnosis: Clinically localized adenocarcinoma of the prostate (clinical stage cT2a Nx Mx)  Postoperative diagnosis: Clinically localized adenocarcinoma of the prostate (clinical stage cT2a Nx Mx)  Procedure:  1. Robotic assisted laparoscopic radical prostatectomy (right nerve sparing) 2. Bilateral robotic assisted laparoscopic pelvic lymphadenectomy  Surgeon: Moody Bruins. M.D.  Assistant(s): Pecola Leisure, PA-C  Anesthesia: General  Complications: None  EBL: 50 mL  IVF:  1500 mL crystalloid  Specimens: 1. Prostate and seminal vesicles 2. Right pelvic lymph nodes 3. Left pelvic lymph nodes 4. Left posterolateral margin  Disposition of specimens: Pathology  Drains: 1. 20 Fr coude catheter 2. # 19 Blake pelvic drain  Indication: Lucas Buckley is a 61 y.o. patient with clinically localized prostate cancer.  After a thorough review of the management options for treatment of prostate cancer, he elected to proceed with surgical therapy and the above procedure(s).  We have discussed the potential benefits and risks of the procedure, side effects of the proposed treatment, the likelihood of the patient achieving the goals of the procedure, and any potential problems that might occur during the procedure or recuperation. Informed consent has been obtained.  Description of procedure:  The patient was taken to the operating room and a general anesthetic was administered. He was given preoperative antibiotics, placed in the dorsal lithotomy position, and prepped and draped in the usual sterile fashion. Next a preoperative timeout was performed. A urethral catheter was placed into the bladder and a site was selected near the umbilicus for placement of the camera port. This was placed using a standard open Hassan technique which allowed entry into the peritoneal cavity under direct vision and without difficulty. A 12 mm port was placed and a pneumoperitoneum  established. The camera was then used to inspect the abdomen and there was no evidence of any intra-abdominal injuries or other abnormalities. The remaining abdominal ports were then placed. 8 mm robotic ports were placed in the right lower quadrant, left lower quadrant, and far left lateral abdominal wall. A 5 mm port was placed in the right upper quadrant and a 12 mm port was placed in the right lateral abdominal wall for laparoscopic assistance. All ports were placed under direct vision without difficulty. The surgical cart was then docked.   Utilizing the cautery scissors, the bladder was reflected posteriorly allowing entry into the space of Retzius and identification of the endopelvic fascia and prostate. The periprostatic fat was then removed from the prostate allowing full exposure of the endopelvic fascia. The endopelvic fascia was then incised from the apex back to the base of the prostate bilaterally and the underlying levator muscle fibers were swept laterally off the prostate thereby isolating the dorsal venous complex. The dorsal vein was then stapled and divided with a 45 mm Flex Echelon stapler. Attention then turned to the bladder neck which was divided anteriorly thereby allowing entry into the bladder and exposure of the urethral catheter. The catheter balloon was deflated and the catheter was brought into the operative field and used to retract the prostate anteriorly. The posterior bladder neck was then examined and was divided allowing further dissection between the bladder and prostate posteriorly until the vasa deferentia and seminal vessels were identified. The vasa deferentia were isolated, divided, and lifted anteriorly. The seminal vesicles were dissected down to their tips with care to control the seminal vascular arterial blood supply. These structures were then lifted anteriorly and the space between Denonvillier's fascia and the anterior rectum was developed with  a combination of  sharp and blunt dissection. This isolated the vascular pedicles of the prostate.  The lateral prostatic fascia on the right side of the prostate was then sharply incised allowing release of the neurovascular bundle. The vascular pedicle of the prostate on the right side was then ligated with Weck clips between the prostate and neurovascular bundle and divided with sharp cold scissor dissection resulting in neurovascular bundle preservation. On the left side, a wide non nerve sparing dissection was performed with Weck clips used to ligate the vascular pedicle of the prostate.  There was noted to be additional firm tissue at the posterolateral aspect of the prostate which was also removed and sent as a separate specimen. The neurovascular bundle on the right side was then separated off the apex of the prostate and urethra.  The urethra was then sharply transected allowing the prostate specimen to be disarticulated. The pelvis was copiously irrigated and hemostasis was ensured. There was no evidence for rectal injury.  Attention then turned to the right pelvic sidewall. The fibrofatty tissue between the external iliac vein, confluence of the iliac vessels, hypogastric artery, and Cooper's ligament was dissected free from the pelvic sidewall with care to preserve the obturator nerve. Weck clips were used for lymphostasis and hemostasis. An identical procedure was performed on the contralateral side and the lymphatic packets were removed for permanent pathologic analysis.  Attention then turned to the urethral anastomosis. A 2-0 Vicryl slip knot was placed between Denonvillier's fascia, the posterior bladder neck, and the posterior urethra to reapproximate these structures. A double-armed 3-0 Monocryl suture was then used to perform a 360 running tension-free anastomosis between the bladder neck and urethra. A new urethral catheter was then placed into the bladder and irrigated. There were no blood clots within  the bladder and the anastomosis appeared to be watertight. A #19 Blake drain was then brought through the left lateral 8 mm port site and positioned appropriately within the pelvis. It was secured to the skin with a nylon suture. The surgical cart was then undocked. The right lateral 12 mm port site was closed at the fascial level with a 0 Vicryl suture placed laparoscopically. All remaining ports were then removed under direct vision. The prostate specimen was removed intact within the Endopouch retrieval bag via the periumbilical camera port site. This fascial opening was closed with two running 0 Vicryl sutures. 0.25% Marcaine was then injected into all port sites and all incisions were reapproximated at the skin level with staples. Sterile dressings were applied. The patient appeared to tolerate the procedure well and without complications. The patient was able to be extubated and transferred to the recovery unit in satisfactory condition.   Moody Bruins MD

## 2011-03-29 NOTE — Preoperative (Addendum)
Beta Blockers   Reason not to administer Beta Blockers:Not Applicable Pt took Metoprolol 03-29-11

## 2011-03-29 NOTE — Transfer of Care (Signed)
Immediate Anesthesia Transfer of Care Note  Patient: Lucas Buckley  Procedure(s) Performed:  ROBOTIC ASSISTED LAPAROSCOPIC RADICAL PROSTATECTOMY LEVEL 2 - BILATERAL PELVIC LYMPHADENECTOMY    ; LYMPHADENECTOMY  Patient Location: PACU  Anesthesia Type: General  Level of Consciousness: sedated, patient cooperative and responds to stimulaton  Airway & Oxygen Therapy: Patient Spontanous Breathing and Patient connected to face mask oxgen  Post-op Assessment: Report given to PACU RN and Post -op Vital signs reviewed and stable  Post vital signs: Reviewed and stable  Complications: No apparent anesthesia complications

## 2011-03-29 NOTE — Anesthesia Postprocedure Evaluation (Signed)
  Anesthesia Post-op Note  Patient: Lucas Buckley  Procedure(s) Performed:  ROBOTIC ASSISTED LAPAROSCOPIC RADICAL PROSTATECTOMY LEVEL 2 - BILATERAL PELVIC LYMPHADENECTOMY    ; LYMPHADENECTOMY  Patient Location: PACU  Anesthesia Type: General  Level of Consciousness: awake and alert   Airway and Oxygen Therapy: Patient Spontanous Breathing  Post-op Pain: mild  Post-op Assessment: Post-op Vital signs reviewed, Patient's Cardiovascular Status Stable, Respiratory Function Stable, Patent Airway and No signs of Nausea or vomiting  Post-op Vital Signs: stable  Complications: No apparent anesthesia complications

## 2011-03-30 ENCOUNTER — Encounter (HOSPITAL_COMMUNITY): Payer: Self-pay | Admitting: Urology

## 2011-03-30 LAB — HEMOGLOBIN AND HEMATOCRIT, BLOOD: HCT: 35.8 % — ABNORMAL LOW (ref 39.0–52.0)

## 2011-03-30 MED ORDER — BISACODYL 10 MG RE SUPP
10.0000 mg | Freq: Once | RECTAL | Status: AC
  Administered 2011-03-30: 10 mg via RECTAL
  Filled 2011-03-30: qty 1

## 2011-03-30 MED ORDER — HYDROCODONE-ACETAMINOPHEN 5-325 MG PO TABS: 1.0000 | ORAL_TABLET | Freq: Four times a day (QID) | ORAL | Status: AC | PRN

## 2011-03-30 MED ORDER — HYDROCODONE-ACETAMINOPHEN 5-325 MG PO TABS
1.0000 | ORAL_TABLET | Freq: Four times a day (QID) | ORAL | Status: DC | PRN
  Administered 2011-03-30 (×2): 1 via ORAL
  Filled 2011-03-30 (×3): qty 1

## 2011-03-30 MED ORDER — CIPROFLOXACIN HCL 500 MG PO TABS: 500.0000 mg | ORAL_TABLET | Freq: Two times a day (BID) | ORAL | Status: AC

## 2011-03-30 NOTE — Progress Notes (Signed)
Patient ID: Lucas Buckley, male   DOB: 1950-10-25, 61 y.o.   MRN: [id number]  1 Day Post-Op Subjective: The patient is doing well.  No nausea or vomiting overnight. Pain is adequately controlled.  Objective: Vital signs in last 24 hours: Temp:  [97.8 F (36.6 C)-98.5 F (36.9 C)] 98.4 F (36.9 C) (02/05 0507) Pulse Rate:  [53-71] 62  (02/05 0507) Resp:  [12-22] 20  (02/05 0507) BP: (126-181)/(64-86) 132/66 mmHg (02/05 0507) SpO2:  [96 %-100 %] 98 % (02/05 0507) Weight:  [122.6 kg (270 lb 4.5 oz)] 122.6 kg (270 lb 4.5 oz) (02/04 1857)  Intake/Output from previous day: 02/04 0701 - 02/05 0700 In: 3950 [P.O.:230; I.V.:2670; IV Piggyback:1050] Out: 1040 [Urine:810; Drains:180; Blood:50] Intake/Output this shift:    Physical Exam:  General: Alert and oriented. CV: RRR Lungs: Clear bilaterally. GI: Soft, Nondistended. Incisions: Dressings intact. Urine: Clear Extremities: Nontender, no erythema, no edema.  Lab Results:  Basename 03/30/11 0403 03/29/11 1735  HGB 12.5* 14.0  HCT 35.8* 39.5      Assessment/Plan: POD# 1 s/p robotic prostatectomy.  1) SL IVF 2) Ambulate, Incentive spirometry 3) Transition to oral pain medication 4) Dulcolax suppository 5) D/C pelvic drain 6) Plan for likely discharge later today   Lucas Buckley. MD   LOS: 1 day   Lucas Buckley,LES 03/30/2011, 7:25 AM

## 2011-03-30 NOTE — Discharge Summary (Signed)
  Date of admission: 03/29/2011  Date of discharge: 03/30/2011  Admission diagnosis: Prostate Cancer  Discharge diagnosis: Prostate Cancer  History and Physical: For full details, please see admission history and physical. Briefly, Lucas Buckley is a 61 y.o. gentleman with localized prostate cancer.  After discussing management/treatment options, he elected to proceed with surgical treatment.  Hospital Course: Lucas Buckley was taken to the operating room on 03/29/2011 and underwent a robotic assisted laparoscopic radical prostatectomy. He tolerated this procedure well and without complications. Postoperatively, he was able to be transferred to a regular hospital room following recovery from anesthesia.  He was able to begin ambulating the night of surgery. He remained hemodynamically stable overnight.  He had excellent urine output with appropriately minimal output from his pelvic drain and his pelvic drain was removed on POD #1.  He was transitioned to oral pain medication, tolerated a clear liquid diet, and had met all discharge criteria and was able to be discharged home later on POD#1.  Laboratory values:  Basename 03/30/11 0403 03/29/11 1735  HGB 12.5* 14.0  HCT 35.8* 39.5    Disposition: Home  Discharge instruction: He was instructed to be ambulatory but to refrain from heavy lifting, strenuous activity, or driving. He was instructed on urethral catheter care.  Discharge medications:   Medication List  As of 03/30/2011  1:36 PM   START taking these medications         ciprofloxacin 500 MG tablet   Commonly known as: CIPRO   Take 1 tablet (500 mg total) by mouth 2 (two) times daily. Start day prior to office visit for foley removal      HYDROcodone-acetaminophen 5-325 MG per tablet   Commonly known as: NORCO   Take 1-2 tablets by mouth every 6 (six) hours as needed for pain.         CONTINUE taking these medications         metoprolol succinate 100 MG 24 hr tablet   Commonly  known as: TOPROL-XL      rosuvastatin 10 MG tablet   Commonly known as: CRESTOR         STOP taking these medications         acetaminophen 650 MG CR tablet      aspirin 325 MG tablet      FISH OIL PO      GARLIC PO      naproxen sodium 220 MG tablet      SAW PALMETTO PO      vitamin C 1000 MG tablet          Where to get your medications    These are the prescriptions that you need to pick up.   You may get these medications from any pharmacy.         ciprofloxacin 500 MG tablet   HYDROcodone-acetaminophen 5-325 MG per tablet            Followup: He will followup in 1 week for catheter and staple removal and to discuss his surgical pathology results.

## 2011-04-04 ENCOUNTER — Emergency Department (HOSPITAL_COMMUNITY): Payer: 59

## 2011-04-04 ENCOUNTER — Emergency Department (HOSPITAL_COMMUNITY)
Admission: EM | Admit: 2011-04-04 | Discharge: 2011-04-04 | Disposition: A | Discharge status: Home / Self Care | Payer: 59 | Attending: Emergency Medicine | Admitting: Emergency Medicine

## 2011-04-04 DIAGNOSIS — Z8546 Personal history of malignant neoplasm of prostate: Secondary | ICD-10-CM | POA: Insufficient documentation

## 2011-04-04 DIAGNOSIS — N200 Calculus of kidney: Secondary | ICD-10-CM

## 2011-04-04 DIAGNOSIS — N1330 Unspecified hydronephrosis: Secondary | ICD-10-CM | POA: Insufficient documentation

## 2011-04-04 DIAGNOSIS — Z79899 Other long term (current) drug therapy: Secondary | ICD-10-CM | POA: Insufficient documentation

## 2011-04-04 DIAGNOSIS — I2510 Atherosclerotic heart disease of native coronary artery without angina pectoris: Secondary | ICD-10-CM | POA: Insufficient documentation

## 2011-04-04 DIAGNOSIS — N201 Calculus of ureter: Secondary | ICD-10-CM | POA: Insufficient documentation

## 2011-04-04 DIAGNOSIS — K219 Gastro-esophageal reflux disease without esophagitis: Secondary | ICD-10-CM | POA: Insufficient documentation

## 2011-04-04 DIAGNOSIS — Z85828 Personal history of other malignant neoplasm of skin: Secondary | ICD-10-CM | POA: Insufficient documentation

## 2011-04-04 DIAGNOSIS — E7800 Pure hypercholesterolemia, unspecified: Secondary | ICD-10-CM | POA: Insufficient documentation

## 2011-04-04 DIAGNOSIS — M129 Arthropathy, unspecified: Secondary | ICD-10-CM | POA: Insufficient documentation

## 2011-04-04 MED ORDER — ONDANSETRON HCL 4 MG PO TABS: 4.0000 mg | ORAL_TABLET | Freq: Four times a day (QID) | ORAL | Status: DC | PRN

## 2011-04-04 MED ORDER — TAMSULOSIN HCL 0.4 MG PO CAPS: 0.4000 mg | ORAL_CAPSULE | Freq: Every day | ORAL | Status: DC

## 2011-04-04 MED ORDER — KETOROLAC TROMETHAMINE 60 MG/2ML IM SOLN
60.0000 mg | Freq: Once | INTRAMUSCULAR | Status: AC
  Administered 2011-04-04: 60 mg via INTRAMUSCULAR
  Filled 2011-04-04: qty 2

## 2011-04-04 MED ORDER — KETOROLAC TROMETHAMINE 60 MG/2ML IM SOLN: 30.0000 mg | Freq: Once | INTRAMUSCULAR | Status: DC

## 2011-04-04 MED ORDER — ONDANSETRON 8 MG PO TBDP
ORAL_TABLET | ORAL | Status: AC
  Administered 2011-04-04: 8 mg
  Filled 2011-04-04: qty 1

## 2011-04-04 MED ORDER — TAMSULOSIN HCL 0.4 MG PO CAPS
0.4000 mg | ORAL_CAPSULE | Freq: Every day | ORAL | Status: DC
  Administered 2011-04-04: 0.4 mg via ORAL
  Filled 2011-04-04: qty 1

## 2011-04-04 MED ORDER — HYDROCODONE-ACETAMINOPHEN 5-325 MG PO TABS: 1.0000 | ORAL_TABLET | Freq: Four times a day (QID) | ORAL | Status: AC | PRN

## 2011-04-04 NOTE — ED Notes (Signed)
Verbal order given by Dr. Adonis Huguenin for po Zofran for nausea and vomiting.

## 2011-04-04 NOTE — ED Notes (Signed)
Post operative pt with severe right sided flank pain that began this AM and has gradually gotten worse.urinary catheter draining and in place.

## 2011-04-04 NOTE — ED Provider Notes (Signed)
History     CSN: [id number]  Arrival date & time 04/04/11  1403   First MD Initiated Contact with Patient 04/04/11 1518      Chief Complaint  Patient presents with  . Nephrolithiasis    (Consider location/radiation/quality/duration/timing/severity/associated sxs/prior treatment) HPI Patient is a 62 year old man with a history of 5-6 episodes of kidney stone and a recent robotic prostactectomy for localized prostate cancer who presents with right flank pain that began this AM along with nausea and vomiting. He was operated on by Dr. Crecencio Mc ([facility]) on 2/4 and was discharged the same day. He has a foley catheter in place which was scheduled to be removed tomorrow morning at 1 week post-op appt. He called urologist on call with symptoms consistent with his previous episodes of stone and they told him to come in to the ED for imaging to confirm stone. He denies any diarrhea or hematochezia or any hematuria or cloudiness of his urine.  Past Medical History  Diagnosis Date  . PONV (postoperative nausea and vomiting)   . Hypercholesterolemia   . GERD (gastroesophageal reflux disease)   . Arthritis   . Cancer     basal cell/ left cheek/    PROSTATE  . Coronary artery disease     ischemic heart disease-  stents x 2- 1999--LOV with clearance and note Dr Patty Sermons, EKG in [facility]    Past Surgical History  Procedure Date  . Lithotripsy   . Cardiac catheterization     1999  . Coronary angioplasty   . Fracture surgery     left ankle and lower leg  . Hernia repair     umbilical  . Testicular     tortion repair in teens  . Robot assisted laparoscopic radical prostatectomy 03/29/2011    Procedure: ROBOTIC ASSISTED LAPAROSCOPIC RADICAL PROSTATECTOMY LEVEL 2;  Surgeon: Crecencio Mc, MD;  Location: [facility];  Service: Urology;  Laterality: N/A;  BILATERAL PELVIC LYMPHADENECTOMY        No family history on file.  History  Substance Use Topics  . Smoking status: Passive Smoker    Types: Pipe, Cigars  . Smokeless tobacco: Never Used   Comment: social cigars or pipe  . Alcohol Use: No     Rarely    Review of Systems As per HPI  Allergies  Review of patient's allergies indicates no known allergies.  Home Medications   Current Outpatient Rx  Name Route Sig Dispense Refill  . CIPROFLOXACIN HCL 500 MG PO TABS Oral Take 1 tablet (500 mg total) by mouth 2 (two) times daily. Start day prior to office visit for foley removal 6 tablet 0  . HYDROCODONE-ACETAMINOPHEN 5-325 MG PO TABS Oral Take 1-2 tablets by mouth every 6 (six) hours as needed for pain. 30 tablet 0  . METOPROLOL SUCCINATE ER 100 MG PO TB24 Oral Take 100 mg by mouth daily before breakfast.    . ROSUVASTATIN CALCIUM 10 MG PO TABS Oral Take 10 mg by mouth daily before breakfast.      BP 142/77  Pulse 64  Temp 98.1 F (36.7 C)  Resp 20  SpO2 98%  Physical Exam General: elderly male resting in bed HEENT: PERRL, EOMI, no scleral icterus Cardiac: RRR, no rubs, murmurs or gallops Pulm: clear to auscultation bilaterally, moving normal volumes of air Abd: soft, nontender, nondistended, BS present Back: mild tenderness of right CVA area, no left CVA tenderness Ext: warm and well perfused, no pedal edema Neuro: alert and  oriented X3, cranial nerves II-XII grossly intact  ED Course  Procedures (including critical care time)  Labs Reviewed - No data to display No results found.   No diagnosis found.    MDM  IV unable to place IV in patient. Is likely a repeat kidney stone, though could be a delayed complication of his surgery. Will get noncontrast abdominal CT and treat pain with IM toradol and nausea with oral zofran and touch base with [facility] for any changes to his management.        Margorie John, MD 04/04/11 1600

## 2011-04-04 NOTE — ED Notes (Signed)
Unable to obtain IV access. IV nurse paged.

## 2011-04-04 NOTE — ED Notes (Signed)
Right sided flank pain patient rates as a 5 which started this AM.  Pt. Has history of kidney stones and reports the pain is similar.  Had surgery one week ago for removal of prostate for CA.

## 2011-04-05 ENCOUNTER — Other Ambulatory Visit: Payer: Self-pay | Admitting: Urology

## 2011-04-05 MED ORDER — CIPROFLOXACIN HCL 500 MG PO TABS
ORAL_TABLET | ORAL | Status: AC
  Filled 2011-04-05: qty 1

## 2011-04-05 MED ORDER — DIPHENHYDRAMINE HCL 25 MG PO CAPS
ORAL_CAPSULE | ORAL | Status: AC
  Filled 2011-04-05: qty 1

## 2011-04-05 MED ORDER — DIAZEPAM 5 MG PO TABS
ORAL_TABLET | ORAL | Status: AC
  Filled 2011-04-05: qty 2

## 2011-04-05 NOTE — ED Provider Notes (Signed)
I saw and evaluated the patient, reviewed the resident's note and I agree with the findings and plan.  I saw the patient along with Dr. Maisie Fus and I agree with her assessment and plan.  The patient presented complaining of pain in the right flank that began this morning.  He has a history of kidney stones and this feels the same.  On exam, the heart and lungs were normal, and the patient was ttp in the right flank and abdomen without rebound or guarding.  IV access was difficult and the nurses were unable to obtain an IV.  He was given IM meds and sent for a ct of the abdomen to rule out a kidney stone.  This was done and revealed a right sided ureteral stone with hydronephrosis.  He felt better with the meds given.  He will discharged to home with follow up with urology if not improving in the next few days.  Geoffery Lyons, MD 04/05/11 [phone]

## 2011-04-08 ENCOUNTER — Encounter (HOSPITAL_COMMUNITY): Payer: Self-pay | Admitting: *Deleted

## 2011-04-08 NOTE — Progress Notes (Signed)
Instructed to read everything in blue folder and fill out history form prior to arrival. Follow instructions AV:[id number] medications prior to ESWL. Laxative as directed by doctor plenty of fluids and light dinner day prior to procedure.

## 2011-04-08 NOTE — Progress Notes (Signed)
Left final message for patient . per Orbie Pyo  Informed patient he needs to call us back with in 24 hours. If he does not we are to notify office we were unsuccessful getting in touch with him and procedure might be postponed.We will not be calling him back.

## 2011-04-08 NOTE — Progress Notes (Signed)
May take pain medication if needed with sip of water day of procedure.

## 2011-04-12 ENCOUNTER — Ambulatory Visit (HOSPITAL_COMMUNITY)
Admission: RE | Admit: 2011-04-12 | Discharge: 2011-04-12 | Disposition: A | Discharge status: Home / Self Care | Payer: 59 | Source: Ambulatory Visit | Attending: Urology | Admitting: Urology

## 2011-04-12 ENCOUNTER — Encounter (HOSPITAL_COMMUNITY): Payer: Self-pay | Admitting: *Deleted

## 2011-04-12 ENCOUNTER — Encounter (HOSPITAL_COMMUNITY)
Admission: RE | Disposition: A | Discharge status: Home / Self Care | Payer: Self-pay | Source: Ambulatory Visit | Attending: Urology

## 2011-04-12 ENCOUNTER — Ambulatory Visit (HOSPITAL_COMMUNITY): Payer: 59

## 2011-04-12 DIAGNOSIS — I2510 Atherosclerotic heart disease of native coronary artery without angina pectoris: Secondary | ICD-10-CM | POA: Insufficient documentation

## 2011-04-12 DIAGNOSIS — R32 Unspecified urinary incontinence: Secondary | ICD-10-CM | POA: Insufficient documentation

## 2011-04-12 DIAGNOSIS — Z8546 Personal history of malignant neoplasm of prostate: Secondary | ICD-10-CM | POA: Insufficient documentation

## 2011-04-12 DIAGNOSIS — N529 Male erectile dysfunction, unspecified: Secondary | ICD-10-CM | POA: Insufficient documentation

## 2011-04-12 DIAGNOSIS — N201 Calculus of ureter: Secondary | ICD-10-CM | POA: Insufficient documentation

## 2011-04-12 DIAGNOSIS — Z79899 Other long term (current) drug therapy: Secondary | ICD-10-CM | POA: Insufficient documentation

## 2011-04-12 DIAGNOSIS — Z7982 Long term (current) use of aspirin: Secondary | ICD-10-CM | POA: Insufficient documentation

## 2011-04-12 DIAGNOSIS — I421 Obstructive hypertrophic cardiomyopathy: Secondary | ICD-10-CM | POA: Insufficient documentation

## 2011-04-12 HISTORY — DX: Calculus of kidney: N20.0

## 2011-04-12 HISTORY — DX: Personal history of other diseases of the digestive system: Z87.19

## 2011-04-12 SURGERY — LITHOTRIPSY, ESWL
Anesthesia: LOCAL | Laterality: Right

## 2011-04-12 MED ORDER — CIPROFLOXACIN HCL 500 MG PO TABS
ORAL_TABLET | ORAL | Status: AC
  Administered 2011-04-12: 500 mg via ORAL
  Filled 2011-04-12: qty 1

## 2011-04-12 MED ORDER — DIPHENHYDRAMINE HCL 25 MG PO CAPS
25.0000 mg | ORAL_CAPSULE | ORAL | Status: AC
  Administered 2011-04-12: 25 mg via ORAL

## 2011-04-12 MED ORDER — DIPHENHYDRAMINE HCL 25 MG PO CAPS
ORAL_CAPSULE | ORAL | Status: AC
  Filled 2011-04-12: qty 1

## 2011-04-12 MED ORDER — CIPROFLOXACIN HCL 500 MG PO TABS
500.0000 mg | ORAL_TABLET | ORAL | Status: AC
  Administered 2011-04-12: 500 mg via ORAL

## 2011-04-12 MED ORDER — DIAZEPAM 5 MG PO TABS
ORAL_TABLET | ORAL | Status: AC
  Filled 2011-04-12: qty 2

## 2011-04-12 MED ORDER — SODIUM CHLORIDE 0.45 % IV SOLN
INTRAVENOUS | Status: DC
  Administered 2011-04-12: 15:00:00 via INTRAVENOUS

## 2011-04-12 MED ORDER — DIAZEPAM 5 MG PO TABS
10.0000 mg | ORAL_TABLET | ORAL | Status: AC
  Administered 2011-04-12: 10 mg via ORAL

## 2011-04-12 NOTE — H&P (Signed)
History of Present Illness     [name] is a 61 year old with the following urologic history:  1) Prostate cancer: He is s/p a UNS RAL radical prostatectomy on 03/29/11.   Diagnosis: pT3a N0 Mx, Gleason 3+4=7 adenocarcinoma with negative surgical margins Pretreatment PSA: 2.47 Pretreatment SHIM: 18  2) Urolithiasis: He has a history of bilateral nephrolithiasis.  Interval history:  He follows up today one week out from his radical prostatectomy. He was recovering as expected until yesterday when he developed the acute onset of severe right-sided flank pain along with nausea and vomiting. He presented to the emergency department and underwent a CT stone study which indicated a 6 mm proximal right ureteral calculus. He was afebrile his pain was able to be controlled. He was discharged from the emergency department and has tentatively been scheduled for shockwave lithotripsy later today. However, it is noted that he was administered IM Toradol in the emergency room yesterday. He presents today with a KUB x-ray. He is currently pain free and has not required any pain medication since discharge from the emergency department. He does have a prescription for both hydrocodone and tamsulosin.   Past Medical History Problems  1. History of  Coronary Artery Disease V12.59 2. History of  Idiopathic Hypertrophic Subaortic Stenosis 425.11 3. History of  Nephrolithiasis V13.01 4. History of  Testicle Torsion 608.20  Surgical History Problems  1. History of  Biopsy Of The Prostate Needle 2. History of  Cystoscopy With Ureteroscopy With Lithotripsy Left 3. History of  Leg Repair 4. History of  Lithotripsy Left 5. History of  Lithotripsy Left 6. History of  Prostatectomy Robotic-Assisted 7. History of  Surgery Testis Orchiopexy Bilateral 8. History of  Umbilical Hernia Repair  Current Meds 1. Aspirin TABS; 325mg ; 1 per day; Therapy: (Recorded:27Nov2012) to 2. Crestor TABS; Therapy:  (Recorded:27Nov2012) to 3. Levitra 10 MG Oral Tablet; Take 1 tablet as needed; Therapy: 12Mar2010 to (Last Rx:27Nov2012)   Requested for: 27Nov2012 4. Metoprolol Succinate ER TB24; Therapy: (Recorded:27Nov2012) to  Allergies Medication  1. No Known Drug Allergies  Family History Problems  1. Paternal history of  Death In The Family Father age 38; heart problems Denied  2. Family history of  Prostate Cancer  Review of Systems  Genitourinary: no hematuria.  Gastrointestinal: vomiting.  Constitutional: no fever.  Cardiovascular: no leg swelling.    Vitals Vital Signs [Data Includes: Last 1 Day]  11Feb2013 09:04AM  BMI Calculated: 35.12 BSA Calculated: 2.42 Weight: 265 lb  Blood Pressure: 106 / 63 Heart Rate: 84  Physical Exam Constitutional: Well nourished and well developed . No acute distress.  ENT:. The ears and nose are normal in appearance.  Neck: The appearance of the neck is normal and no neck mass is present.  Pulmonary: No respiratory distress and normal respiratory rhythm and effort.  Cardiovascular: Heart rate and rhythm are normal . No peripheral edema.  Abdomen: The abdomen is soft and nontender. No CVA tenderness.  Neuro/Psych:. Mood and affect are appropriate.    Results/Data  I independently reviewed his CT stone study from yesterday and emergency department notes. I also reviewed his KUB x-ray today. On his CT stone study yesterday, he was noted to have a 5-6 mm proximal right ureteral calculus. This was a radio opaque on his scout film. On his KUB today, the calcification is now located at the level of the distal ureter has migrated significantly from yesterday.     Procedure  1. Staples were removed  and replaced with Steri-Strips.  2. His bladder was filled with 120 cc of sterile saline. The catheter was removed and he was able to void to completion.   Assessment Assessed  1. Adenocarcinoma Of The Prostate Gland 185 2. Ureteral Stone 592.1 3. Male  Erectile Disorder Due To Physical Condition 607.84 4. Male Stress Incontinence 788.32  Plan Adenocarcinoma Of The Prostate Gland (185)  1. Follow-up Keep Future Appt Office  Follow-up  Done: 11Feb2013 2. Follow-up Office  Follow-up  Done: 11Feb2013 Nephrolithiasis Of Both Kidneys (592.0)  3. KUB 4. Follow-up Office  Follow-up Ureteral Stone (592.1)  5. Follow-up Schedule Surgery Office  Follow-up  Done: 11Feb2013  Discussion/Summary  1. Prostate cancer: We reviewed the surgical pathology results which indicate a locally advanced prostate cancer with negative lymph nodes and negative surgical margins. I have recommended PSA surveillance and we will followup as planned in early March for further evaluation will ultimately need his first postoperative PSA about 6-8 weeks from now.  2. Incontinence: He will begin pelvic floor muscle training as previously instructed.  3. Erectile dysfunction: Will discuss this further once he recovers from his acute stone event.  4. Right ureteral stone: He will begin medical expulsion therapy with tamsulosin. He has been instructed that this is off label use for this medication. He will strain his urine and will call should he develop fever, uncontrolled pain, or persistent nausea/vomiting. His lithotripsy will be canceled today since he did receive Toradol in the emergency department yesterday. He will tentatively be placed on a schedule for one week from now with either myself or Dr. Vernie Ammons if he does not pass the stone. He has been instructed to call should he pass his stone.  CC: Dr. Ihor Gully Dr. Elias Else   Signatures Electronically signed by : Heloise Purpura, M.D.; Apr 05 2011  2:12PM

## 2011-04-12 NOTE — Discharge Instructions (Signed)
  HOME CARE INSTRUCTIONS   Rest at home until you feel your energy improving.   Only take over-the-counter or prescription medicines for pain, discomfort, or fever as directed by your caregiver.   Drink enough water and fluids to keep your urine clear or pale yellow. This helps "flush" your kidneys. It helps pass any remaining pieces of stone and prevents stones from coming back.   If the stones are in your urinary system, you may be asked to strain your urine at home to look for stones. Any stones that are found can be sent to a medical lab for examination.    SEEK MEDICAL CARE IF:   You have an oral temperature above 101 F.  Your pain is not relieved by medicine.   You have a lasting nauseous feeling.   You feel there is too much blood in the urine.   You develop persistent problems with frequent and/or painful urination that does not at least partially improve after 2 days following the procedure.   You have a congested cough.   You feel lightheaded.   You develop a rash or any other signs that might suggest an allergic problem.   You develop any reaction or side effects to your medicine(s).  SEEK IMMEDIATE MEDICAL CARE IF:   You experience severe back and/or flank pain.   You see nothing but blood when you urinate.   You cannot pass any urine at all.   You have an oral temperature above 102 F (38.9 C), not controlled by medicine.   You develop shortness of breath, difficulty breathing, or chest pain.   You develop vomiting that will not stop after 6 to 8 hours.   You have a fainting episode.  MAKE SURE YOU:   Understand these instructions.   Will watch your condition.   Will get help right away if you are not doing well or get worse.  Document Released: 02/28/2007 Document Revised: 10/07/2010 Document Reviewed: 02/28/2007 [facility] Patient Information 2012 [place], [place].

## 2011-04-12 NOTE — Op Note (Signed)
See operative note in paper chart. 

## 2011-06-15 ENCOUNTER — Telehealth: Payer: Self-pay | Admitting: Cardiology

## 2011-06-15 NOTE — Telephone Encounter (Signed)
Please return call to patient at [phone]  Patient has ROV sched for 6/4, but experiencing some heart rhythm problems.  Please return call to patient at [phone].

## 2011-06-15 NOTE — Telephone Encounter (Signed)
Spoke with pt. Pt checked his pulse this morning and felt an irregular heart beat. Pt states it would be regular for 3 beats then feel a pause. This lasted for a few minutes and he has never felt this before, no history of at fib.  Pt states his pulse is regular now and he denies any lightheadedness or dizziness. He walked 4 miles yesterday and hit golf balls without any problems. I offered pt an appt with Lawson Fiscal today. Pt declined appt today. Pt states he feels fine. Pt will monitor symptoms and call if he has any recurrence.

## 2011-07-20 ENCOUNTER — Other Ambulatory Visit (INDEPENDENT_AMBULATORY_CARE_PROVIDER_SITE_OTHER): Payer: 59

## 2011-07-20 DIAGNOSIS — E7800 Pure hypercholesterolemia, unspecified: Secondary | ICD-10-CM

## 2011-07-20 LAB — LIPID PANEL
Cholesterol: 160 mg/dL (ref 0–200)
LDL Cholesterol: 99 mg/dL (ref 0–99)
Triglycerides: 120 mg/dL (ref 0.0–149.0)
VLDL: 24 mg/dL (ref 0.0–40.0)

## 2011-07-20 LAB — HEPATIC FUNCTION PANEL
ALT: 26 U/L (ref 0–53)
Albumin: 3.8 g/dL (ref 3.5–5.2)
Alkaline Phosphatase: 41 U/L (ref 39–117)
Total Protein: 7.2 g/dL (ref 6.0–8.3)

## 2011-07-20 LAB — BASIC METABOLIC PANEL
Chloride: 106 mEq/L (ref 96–112)
Creatinine, Ser: 1 mg/dL (ref 0.4–1.5)
GFR: 81.55 mL/min (ref >60.00)

## 2011-07-20 NOTE — Progress Notes (Signed)
Quick Note:  Please make copy of labs for patient visit. ______ 

## 2011-07-27 ENCOUNTER — Ambulatory Visit (INDEPENDENT_AMBULATORY_CARE_PROVIDER_SITE_OTHER): Payer: 59 | Admitting: Cardiology

## 2011-07-27 ENCOUNTER — Encounter: Payer: Self-pay | Admitting: Cardiology

## 2011-07-27 VITALS — BP 155/78 | HR 56 | Ht 73.0 in | Wt 264.0 lb

## 2011-07-27 DIAGNOSIS — I421 Obstructive hypertrophic cardiomyopathy: Secondary | ICD-10-CM

## 2011-07-27 DIAGNOSIS — I259 Chronic ischemic heart disease, unspecified: Secondary | ICD-10-CM

## 2011-07-27 DIAGNOSIS — Z8546 Personal history of malignant neoplasm of prostate: Secondary | ICD-10-CM | POA: Insufficient documentation

## 2011-07-27 DIAGNOSIS — I119 Hypertensive heart disease without heart failure: Secondary | ICD-10-CM

## 2011-07-27 DIAGNOSIS — E7800 Pure hypercholesterolemia, unspecified: Secondary | ICD-10-CM

## 2011-07-27 NOTE — Assessment & Plan Note (Signed)
No dizzy spells or chest pain or palpitations. Symptoms from his IHSS and he remains on beta blocker

## 2011-07-27 NOTE — Assessment & Plan Note (Signed)
Patient walks daily for exercise.  He goes to the [facility] once or twice a week for a work out.  He's not had any chest pain or shortness of breath.

## 2011-07-27 NOTE — Progress Notes (Signed)
Vedia Coffer Date of Birth:  Nov 17, 1950 [facility] [address] [place], [place]  [address] [phone]  Fax   [phone]  HPI: This pleasant middle-aged gentleman is seen for a scheduled followup office visit.  The patient has a history of known ischemic heart disease.  In 1999 he had stents to his right coronary artery and to his circumflex by Dr. Swaz[place].  The patient has a history of IHSS.  He also has a history of hypercholesterolemia and borderline glucose intolerance and exogenous obesity.  In February 2013 he underwent robotic surgery on his prostate for prostate cancer by Dr. Laverle Patter. Current Outpatient Prescriptions  Medication Sig Dispense Refill  . acetaminophen (TYLENOL) 650 MG CR tablet Take 650 mg by mouth every 8 (eight) hours as needed.      . metoprolol succinate (TOPROL-XL) 100 MG 24 hr tablet Take 100 mg by mouth daily before breakfast.      . rosuvastatin (CRESTOR) 10 MG tablet Take 10 mg by mouth daily before breakfast.      . tadalafil (CIALIS) 5 MG tablet Take 5 mg by mouth daily.        No Known Allergies  Patient Active Problem List  Diagnoses  . Hypercholesterolemia  . Ischemic heart disease  . IHSS (idiopathic hypertrophic subaortic stenosis)    History  Smoking status  . Never Smoker   Smokeless tobacco  . Never Used  Comment: social cigars or pipe    History  Alcohol Use No    Rarely    No family history on file.  Review of Systems: The patient denies any heat or cold intolerance.  No weight gain or weight loss.  The patient denies headaches or blurry vision.  There is no cough or sputum production.  The patient denies dizziness.  There is no hematuria or hematochezia.  The patient denies any muscle aches or arthritis.  The patient denies any rash.  The patient denies frequent falling or instability.  There is no history of depression or anxiety.  All other systems were reviewed and are negative.   Physical  Exam: Filed Vitals:   07/27/11 1053  BP: 155/78  Pulse: 56   the general appearance reveals a large gentleman in no distress.The head and neck exam reveals pupils equal and reactive.  Extraocular movements are full.  There is no scleral icterus.  The mouth and pharynx are normal.  The neck is supple.  The carotids reveal no bruits.  The jugular venous pressure is normal.  The  thyroid is not enlarged.  There is no lymphadenopathy.  The chest is clear to percussion and auscultation.  There are no rales or rhonchi.  Expansion of the chest is symmetrical.  The precordium is quiet.  The first heart sound is normal.  The second heart sound is physiologically split.  There is no gallop rub or click.  There is a grade 2/6 systolic ejection murmur at the base.  No diastolic murmur.  There is no abnormal lift or heave.  The abdomen is soft and nontender.  The bowel sounds are normal.  The liver and spleen are not enlarged.  There are no abdominal masses.  There are no abdominal bruits.  Extremities reveal good pedal pulses.  There is no phlebitis or edema.  There is no cyanosis or clubbing.  Strength is normal and symmetrical in all extremities.  There is no lateralizing weakness.  There are no sensory deficits.  The skin is warm and  dry.  There is no rash.      Assessment / Plan: The patient will work harder on exercise weight loss and sodium restriction.  He needs to drink plenty of water.  He also has a history of kidney stones.  He will return in 6 months for followup office visit and fasting lipid panel hepatic function panel and basal metabolic panel

## 2011-07-27 NOTE — Assessment & Plan Note (Signed)
Patient has a history of hypercholesterolemia.  His lipids this time are not quite as good because of less exercise and because of weight gain which he attributes in part to his recovery from robotic surgery 4 months ago.  The patient is on Crestor.  He is not having any myalgias from the Crestor

## 2011-07-27 NOTE — Patient Instructions (Addendum)
Your physician wants you to follow-up in: 6 months  You will receive a reminder letter in the mail two months in advance. If you don't receive a letter, please call our office to schedule the follow-up appointment.   Your physician recommends that you continue on your current medications as directed. Please refer to the Current Medication list given to you today.   Your physician recommends that you return for a FASTING lipid profile: 6 months   

## 2012-02-18 ENCOUNTER — Other Ambulatory Visit: Payer: Self-pay | Admitting: *Deleted

## 2012-02-18 MED ORDER — METOPROLOL SUCCINATE ER 100 MG PO TB24: 100.0000 mg | ORAL_TABLET | Freq: Every day | ORAL | Status: DC

## 2012-03-20 ENCOUNTER — Other Ambulatory Visit: Payer: Self-pay

## 2012-03-20 MED ORDER — ROSUVASTATIN CALCIUM 10 MG PO TABS: 10.0000 mg | ORAL_TABLET | Freq: Every day | ORAL | Status: DC

## 2012-07-18 ENCOUNTER — Encounter: Payer: Self-pay | Admitting: Cardiology

## 2012-07-21 ENCOUNTER — Other Ambulatory Visit: Payer: Self-pay | Admitting: Urology

## 2012-07-25 ENCOUNTER — Encounter (HOSPITAL_COMMUNITY): Payer: Self-pay | Admitting: Pharmacy Technician

## 2012-08-02 ENCOUNTER — Encounter (HOSPITAL_COMMUNITY): Payer: Self-pay | Admitting: *Deleted

## 2012-08-03 ENCOUNTER — Ambulatory Visit (HOSPITAL_COMMUNITY)
Admission: RE | Admit: 2012-08-03 | Discharge: 2012-08-03 | Disposition: A | Discharge status: Home / Self Care | Payer: Commercial Managed Care - PPO | Source: Ambulatory Visit | Attending: Urology | Admitting: Urology

## 2012-08-03 DIAGNOSIS — I498 Other specified cardiac arrhythmias: Secondary | ICD-10-CM | POA: Insufficient documentation

## 2012-08-03 DIAGNOSIS — R9431 Abnormal electrocardiogram [ECG] [EKG]: Secondary | ICD-10-CM | POA: Insufficient documentation

## 2012-08-03 DIAGNOSIS — Z01810 Encounter for preprocedural cardiovascular examination: Secondary | ICD-10-CM | POA: Insufficient documentation

## 2012-08-05 NOTE — H&P (Signed)
History of Present Illness  [name] is a 62 year old with the following urologic history:  1) Prostate cancer: He is s/p a UNS RAL radical prostatectomy on 03/29/11. His PSA has been undetectable since treatment.  Diagnosis: pT3a N0 Mx, Gleason 3+4=7 adenocarcinoma with negative surgical margins Pretreatment PSA: 2.47 Pretreatment SHIM: 18  2) Urolithiasis: He has a history of bilateral nephrolithiasis.   Prior treatment: Feb 2013: R ESWL  Interval history:  He follows up today for prostate cancer surveillance however one year out from his radical prostatectomy.  He continues to maintain good continence and denies any new urinary complaints.  He has seen some positive change with regard to his erectile function although still has difficulty obtaining erections adequate for intercourse.  SHIM score is 12.  He continues to utilize Cialis 5 mg daily and continues to get an excellent price on this medication.  He remains asymptomatic from his known left renal calculi and denies specifically any flank pain or hematuria.     Past Medical History Problems  1. History of  Coronary Artery Disease V12.59 2. History of  Idiopathic Hypertrophic Subaortic Stenosis 425.11 3. History of  Nephrolithiasis V13.01 4. History of  Testicle Torsion 608.20  Surgical History Problems  1. History of  Biopsy Of The Prostate Needle 2. History of  Cystoscopy With Ureteroscopy With Lithotripsy Left 3. History of  Leg Repair 4. History of  Lithotripsy Left 5. History of  Lithotripsy Left 6. History of  Lithotripsy 7. History of  Prostatectomy Robotic-Assisted 8. History of  Surgery Testis Orchiopexy Bilateral 9. History of  Umbilical Hernia Repair  Current Meds 1. Aleve TABS; TAKE TABLET  PRN; Therapy: (Recorded:05Mar2013) to 2. Aspirin TABS; 325mg ; 1 per day; Therapy: (Recorded:27Nov2012) to 3. Colcrys 0.6 MG Oral Tablet; Therapy: 25Sep2012 to 4. Crestor TABS; Therapy: (Recorded:27Nov2012) to 5.  Metoprolol Succinate ER TB24; Therapy: (Recorded:27Nov2012) to 6. Vitamin C TABS; TAKE TABLET  PRN; Therapy: (Recorded:05Mar2013) to  Allergies Medication  1. No Known Drug Allergies  Family History Problems  1. Paternal history of  Death In The Family Father age 82; heart problems Denied  2. Family history of  Prostate Cancer  Social History Problems  1. Marital History - Currently Married 2. Never A Smoker 3. Occupation: Public house manager  4. History of  Alcohol Use 5. History of  Tobacco Use  Review of Systems  Genitourinary: no hematuria.  Musculoskeletal: no back pain.    Vitals Vital Signs [Data Includes: Last 1 Day]  28May2014 02:36PM  BMI Calculated: 35.12 BSA Calculated: 2.42 Height: 6 ft 1 in Weight: 265 lb  Blood Pressure: 167 / 71 Heart Rate: 56  Physical Exam Constitutional: Well nourished and well developed . No acute distress.  ENT:. The ears and nose are normal in appearance.  Neck: The appearance of the neck is normal and no neck mass is present.  Pulmonary: No respiratory distress and normal respiratory rhythm and effort.  Cardiovascular: Heart rate and rhythm are normal . No peripheral edema.  Abdomen: No CVA tenderness.  Skin: Normal skin turgor, no visible rash and no visible skin lesions.  Neuro/Psych:. Mood and affect are appropriate.    Results/Data Urine [Data Includes: Last 1 Day]   28May2014  COLOR YELLOW   APPEARANCE CLEAR   SPECIFIC GRAVITY 1.020   pH 5.5   GLUCOSE NEG mg/dL  BILIRUBIN NEG   KETONE NEG mg/dL  BLOOD NEG   PROTEIN NEG mg/dL  UROBILINOGEN 0.2 mg/dL  NITRITE NEG  LEUKOCYTE ESTERASE NEG   Selected Results  PSA 21May2014 12:21PM Lucas Buckley  SPECIMEN TYPE: BLOOD   Test Name Result Flag Reference  PSA <0.01 ng/mL  <=4.00  RESULT REPEATED AND VERIFIED. TEST METHODOLOGY: ECLIA PSA (ELECTROCHEMILUMINESCENCE IMMUNOASSAY)     I independently reviewed his KUB x-ray.  This demonstrates an enlarging index left  renal calculus which is nonobstructing.  This now measures approximately 12 mm which is increased from his prior to x-rays.  He has not yet completed his 24-hour urine.  Assessment Assessed  1. Nephrolithiasis Of Both Kidneys 592.0 2. Adenocarcinoma Of The Prostate Gland 185 3. Male Erectile Disorder Due To Physical Condition 607.84  Plan Adenocarcinoma Of The Prostate Gland (185)  1. PSA  Requested for: 28May2014 2. Follow-up Month x 6 Office  Follow-up  Requested for: 28May2014 Health Maintenance (V70.0)  3. UA With REFLEX  Done: 28May2014 02:18PM Nephrolithiasis Of Both Kidneys (592.0)  4. Follow-up Office  Follow-up  Requested for: 28May2014  Discussion/Summary  1.  Prostate cancer: No evidence for disease recurrence.  Follow-up in 6 months for continued surveillance.  2.  Erectile dysfunction: We reviewed options.  He would like to continue Cialis 5 mg daily and his prescription will be refilled prn.  3.  Left renal calculi: He has not completed his metabolic evaluation at this time and he does have evidence of continued stone growth.  We discussed options including observation versus definitive therapy.  Considering that his stone continues to increase in size, he understands that he is a candidate for all available surgical interventions including shockwave lithotripsy, percutaneous nephrolithotomy, and ureteroscopic laser lithotripsy at this point.  He does desire to proceed with shockwave lithotripsy understanding the pros and cons of each approach.  We have reviewed the potential risks, complications, alternative options, and expected recovery process.  He gives his informed consent today.  This will be scheduled in the near future.  Once he recovers from this procedure, I have recommended that he complete his 24-hour urine studies to become more aggressive with stone prevention in the future.  Cc: Dr. Elias Else     Signatures Electronically signed by : Lucas Buckley, M.D.;  Jul 19 2012  5:11PM

## 2012-08-07 ENCOUNTER — Encounter (HOSPITAL_COMMUNITY): Payer: Self-pay

## 2012-08-07 ENCOUNTER — Ambulatory Visit (HOSPITAL_COMMUNITY): Payer: Commercial Managed Care - PPO

## 2012-08-07 ENCOUNTER — Ambulatory Visit (HOSPITAL_COMMUNITY)
Admission: RE | Admit: 2012-08-07 | Discharge: 2012-08-07 | Disposition: A | Discharge status: Home / Self Care | Payer: Commercial Managed Care - PPO | Source: Ambulatory Visit | Attending: Urology | Admitting: Urology

## 2012-08-07 ENCOUNTER — Encounter (HOSPITAL_COMMUNITY)
Admission: RE | Disposition: A | Discharge status: Home / Self Care | Payer: Self-pay | Source: Ambulatory Visit | Attending: Urology

## 2012-08-07 DIAGNOSIS — Z7982 Long term (current) use of aspirin: Secondary | ICD-10-CM | POA: Insufficient documentation

## 2012-08-07 DIAGNOSIS — N529 Male erectile dysfunction, unspecified: Secondary | ICD-10-CM | POA: Insufficient documentation

## 2012-08-07 DIAGNOSIS — Z9079 Acquired absence of other genital organ(s): Secondary | ICD-10-CM | POA: Insufficient documentation

## 2012-08-07 DIAGNOSIS — Z87442 Personal history of urinary calculi: Secondary | ICD-10-CM | POA: Insufficient documentation

## 2012-08-07 DIAGNOSIS — Z79899 Other long term (current) drug therapy: Secondary | ICD-10-CM | POA: Insufficient documentation

## 2012-08-07 DIAGNOSIS — I2510 Atherosclerotic heart disease of native coronary artery without angina pectoris: Secondary | ICD-10-CM | POA: Insufficient documentation

## 2012-08-07 DIAGNOSIS — I421 Obstructive hypertrophic cardiomyopathy: Secondary | ICD-10-CM | POA: Insufficient documentation

## 2012-08-07 DIAGNOSIS — N200 Calculus of kidney: Secondary | ICD-10-CM | POA: Insufficient documentation

## 2012-08-07 SURGERY — LITHOTRIPSY, ESWL
Anesthesia: LOCAL | Laterality: Left

## 2012-08-07 MED ORDER — DEXTROSE-NACL 5-0.45 % IV SOLN
INTRAVENOUS | Status: DC
  Administered 2012-08-07: 15:00:00 via INTRAVENOUS

## 2012-08-07 MED ORDER — CIPROFLOXACIN HCL 500 MG PO TABS
500.0000 mg | ORAL_TABLET | ORAL | Status: AC
  Administered 2012-08-07: 500 mg via ORAL
  Filled 2012-08-07: qty 1

## 2012-08-07 MED ORDER — DIAZEPAM 5 MG PO TABS
10.0000 mg | ORAL_TABLET | ORAL | Status: AC
  Administered 2012-08-07: 10 mg via ORAL
  Filled 2012-08-07: qty 2

## 2012-08-07 MED ORDER — DIPHENHYDRAMINE HCL 25 MG PO CAPS
25.0000 mg | ORAL_CAPSULE | ORAL | Status: AC
  Administered 2012-08-07: 25 mg via ORAL
  Filled 2012-08-07: qty 1

## 2012-08-07 NOTE — Interval H&P Note (Signed)
History and Physical Interval Note:  08/07/2012 2:09 PM  Lucas Buckley  has presented today for surgery, with the diagnosis of Left Renal Calculi  The various methods of treatment have been discussed with the patient and family. After consideration of risks, benefits and other options for treatment, the patient has consented to  Procedure(s): LEFT EXTRACORPOREAL SHOCK WAVE LITHOTRIPSY (ESWL) (Left) as a surgical intervention .  The patient's history has been reviewed, patient examined, no change in status, stable for surgery.  I have reviewed the patient's chart and labs.  Questions were answered to the patient's satisfaction.     Carmella Kees,LES

## 2012-08-07 NOTE — Op Note (Signed)
See paper chart for operative note for ESWL. 

## 2012-08-07 NOTE — H&P (Signed)
  It was confirmed that Lucas Buckley had discontinued his aspirin 5 days prior to his ESWL today.

## 2012-08-16 ENCOUNTER — Other Ambulatory Visit: Payer: Self-pay | Admitting: Cardiology

## 2013-02-16 ENCOUNTER — Other Ambulatory Visit: Payer: Self-pay | Admitting: Cardiology

## 2013-03-16 ENCOUNTER — Other Ambulatory Visit: Payer: Self-pay | Admitting: Cardiology

## 2013-04-15 ENCOUNTER — Other Ambulatory Visit: Payer: Self-pay | Admitting: Cardiology

## 2013-05-18 ENCOUNTER — Other Ambulatory Visit: Payer: Self-pay | Admitting: Cardiology

## 2013-06-04 ENCOUNTER — Other Ambulatory Visit: Payer: Self-pay | Admitting: Cardiology

## 2013-06-15 ENCOUNTER — Encounter (INDEPENDENT_AMBULATORY_CARE_PROVIDER_SITE_OTHER): Payer: Self-pay

## 2013-06-15 ENCOUNTER — Ambulatory Visit (INDEPENDENT_AMBULATORY_CARE_PROVIDER_SITE_OTHER): Payer: Commercial Managed Care - PPO | Admitting: Cardiology

## 2013-06-15 ENCOUNTER — Encounter: Payer: Self-pay | Admitting: Cardiology

## 2013-06-15 VITALS — BP 144/70 | HR 60 | Ht 73.0 in | Wt 271.0 lb

## 2013-06-15 DIAGNOSIS — I259 Chronic ischemic heart disease, unspecified: Secondary | ICD-10-CM

## 2013-06-15 DIAGNOSIS — I421 Obstructive hypertrophic cardiomyopathy: Secondary | ICD-10-CM

## 2013-06-15 DIAGNOSIS — E7800 Pure hypercholesterolemia, unspecified: Secondary | ICD-10-CM

## 2013-06-15 LAB — LIPID PANEL
CHOLESTEROL: 159 mg/dL (ref 0–200)
HDL: 34.9 mg/dL — ABNORMAL LOW (ref >39.00)
LDL CALC: 102 mg/dL — AB (ref 0–99)
TRIGLYCERIDES: 109 mg/dL (ref 0.0–149.0)
Total CHOL/HDL Ratio: 5
VLDL: 21.8 mg/dL (ref 0.0–40.0)

## 2013-06-15 LAB — BASIC METABOLIC PANEL
BUN: 16 mg/dL (ref 6–23)
CHLORIDE: 105 meq/L (ref 96–112)
CO2: 25 mEq/L (ref 19–32)
CREATININE: 0.9 mg/dL (ref 0.4–1.5)
Calcium: 9.6 mg/dL (ref 8.4–10.5)
GFR: 89.32 mL/min (ref >60.00)
Glucose, Bld: 110 mg/dL — ABNORMAL HIGH (ref 70–99)
POTASSIUM: 4.3 meq/L (ref 3.5–5.1)
Sodium: 138 mEq/L (ref 135–145)

## 2013-06-15 LAB — HEPATIC FUNCTION PANEL
ALBUMIN: 4.1 g/dL (ref 3.5–5.2)
ALT: 25 U/L (ref 0–53)
AST: 32 U/L (ref 0–37)
Alkaline Phosphatase: 41 U/L (ref 39–117)
Bilirubin, Direct: 0.1 mg/dL (ref 0.0–0.3)
TOTAL PROTEIN: 7.3 g/dL (ref 6.0–8.3)
Total Bilirubin: 1.2 mg/dL (ref 0.3–1.2)

## 2013-06-15 MED ORDER — METOPROLOL SUCCINATE ER 100 MG PO TB24: 100.0000 mg | ORAL_TABLET | Freq: Every day | ORAL | Status: DC

## 2013-06-15 MED ORDER — ROSUVASTATIN CALCIUM 10 MG PO TABS: ORAL_TABLET | ORAL | Status: DC

## 2013-06-15 NOTE — Assessment & Plan Note (Signed)
The patient has a history of hypercholesterolemia and is on Crestor 10 mg daily.  We are checking lab work today.

## 2013-06-15 NOTE — Patient Instructions (Signed)
Will obtain labs today and call you with the results (LP/BMET/HFP)  Your physician recommends that you continue on your current medications as directed. Please refer to the Current Medication list given to you today.  Your physician wants you to follow-up in: 6 months with fasting labs (lp/bmet/hfp) AND ekg You will receive a reminder letter in the mail two months in advance. If you don't receive a letter, please call our office to schedule the follow-up appointment.

## 2013-06-15 NOTE — Assessment & Plan Note (Signed)
The patient has a history of IHSS.  He is on beta blockers.  He is not having any chest pain or shortness of breath.  He walks 5 miles a day.  He has not been playing much golf recently.

## 2013-06-15 NOTE — Assessment & Plan Note (Signed)
The patient has a history of ischemic heart disease with prior stents.  He is not having any recurrent chest pain or angina.  No dizzy spells or syncope.  There is a family history of coronary disease.  Since we last saw him his 64 year old sister died suddenly.

## 2013-06-15 NOTE — Progress Notes (Signed)
Lucas Buckley Date of Birth:  16-Nov-1950 [address] [place],   [address] [phone]  Fax   [phone]  HPI: This pleasant middle-aged gentleman is seen for a scheduled followup office visit.  The patient has a history of known ischemic heart disease.  In 1999 he had stents to his right coronary artery and to his circumflex by Dr. Martin[place].  The patient has a history of IHSS.  He also has a history of hypercholesterolemia and borderline glucose intolerance and exogenous obesity.  In February 2013 he underwent robotic surgery on his prostate for prostate cancer by Dr. [name].  Since last visit he has been feeling well. Current Outpatient Prescriptions  Medication Sig Dispense Refill  . Difluprednate (DUREZOL) 0.05 % EMUL Place 2 drops into the right eye 2 (two) times daily.      . Nepafenac (ILEVRO) 0.3 % SUSP Place 1 drop into the right eye daily.      . rosuvastatin (CRESTOR) 10 MG tablet 1 TABLET DAILY  30 tablet  5  . tadalafil (CIALIS) 5 MG tablet Take 5 mg by mouth daily.      Marland Kitchen VITAMIN E COMPLEX PO Take 1 capsule by mouth once a week.      . metoprolol succinate (TOPROL-XL) 100 MG 24 hr tablet Take 1 tablet (100 mg total) by mouth daily before breakfast.  30 tablet  5   No current facility-administered medications for this visit.    No Known Allergies  Patient Active Problem List   Diagnosis Date Noted  . H/O prostate cancer 07/27/2011  . Hypercholesterolemia 01/27/2011  . Ischemic heart disease 01/27/2011  . IHSS (idiopathic hypertrophic subaortic stenosis) 01/27/2011    History  Smoking status  . Never Smoker   Smokeless tobacco  . Never Used    Comment: social cigars or pipe    History  Alcohol Use No    Comment: Rarely    No family history on file.  Review of Systems: The patient denies any heat or cold intolerance.  No weight gain or weight loss.  The patient denies headaches or blurry vision.  There is no cough or sputum  production.  The patient denies dizziness.  There is no hematuria or hematochezia.  The patient denies any muscle aches or arthritis.  The patient denies any rash.  The patient denies frequent falling or instability.  There is no history of depression or anxiety.  All other systems were reviewed and are negative.   Physical Exam: Filed Vitals:   06/15/13 0951  BP: 144/70  Pulse: 60   the general appearance reveals a large gentleman in no distress.The head and neck exam reveals pupils equal and reactive.  Extraocular movements are full.  There is no scleral icterus.  The mouth and pharynx are normal.  The neck is supple.  The carotids reveal no bruits.  The jugular venous pressure is normal.  The  thyroid is not enlarged.  There is no lymphadenopathy.  The chest is clear to percussion and auscultation.  There are no rales or rhonchi.  Expansion of the chest is symmetrical.  The precordium is quiet.  The first heart sound is normal.  The second heart sound is physiologically split.  There is no gallop rub or click.  There is a grade 2/6 systolic ejection murmur at the base.  No diastolic murmur.  There is no abnormal lift or heave.  The abdomen is soft and nontender.  The bowel sounds are normal.  The liver and spleen are not enlarged.  There are no abdominal masses.  There are no abdominal bruits.  Extremities reveal good pedal pulses.  There is no phlebitis or edema.  There is no cyanosis or clubbing.  Strength is normal and symmetrical in all extremities.  There is no lateralizing weakness.  There are no sensory deficits.  The skin is warm and dry.  There is no rash.      Assessment / Plan: The patient is walking 5 miles a day without symptoms.  However his weight is up 7 pounds since we last saw him.  He will work harder on diet.  Recheck in 6 months for office visit EKG lipid panel hepatic function panel and basal metabolic panel.

## 2013-06-17 NOTE — Progress Notes (Signed)
Quick Note:  Please report to patient. The recent labs are stable. Continue same medication and careful diet. ______ 

## 2013-06-25 ENCOUNTER — Telehealth: Payer: Self-pay | Admitting: *Deleted

## 2013-06-25 NOTE — Telephone Encounter (Signed)
Mailed copy of labs and left message to call if any questions  

## 2013-06-25 NOTE — Telephone Encounter (Signed)
Message copied by Earvin Hansen on Mon Jun 25, 2013  8:59 AM ------      Message from: Darlin Coco      Created: Sun Jun 17, 2013  8:05 PM       Please report to patient.  The recent labs are stable. Continue same medication and careful diet. ------

## 2013-12-11 ENCOUNTER — Other Ambulatory Visit: Payer: Self-pay | Admitting: Cardiology

## 2014-02-11 ENCOUNTER — Other Ambulatory Visit: Payer: Self-pay | Admitting: Cardiology

## 2014-03-16 ENCOUNTER — Other Ambulatory Visit: Payer: Self-pay | Admitting: Cardiology

## 2014-03-22 ENCOUNTER — Other Ambulatory Visit: Payer: Self-pay

## 2014-03-27 ENCOUNTER — Encounter: Payer: Self-pay | Admitting: Cardiology

## 2014-03-27 ENCOUNTER — Ambulatory Visit (INDEPENDENT_AMBULATORY_CARE_PROVIDER_SITE_OTHER): Payer: Commercial Managed Care - PPO | Admitting: Cardiology

## 2014-03-27 VITALS — BP 150/80 | HR 52 | Ht 73.0 in | Wt 275.0 lb

## 2014-03-27 DIAGNOSIS — I421 Obstructive hypertrophic cardiomyopathy: Secondary | ICD-10-CM

## 2014-03-27 DIAGNOSIS — E7800 Pure hypercholesterolemia, unspecified: Secondary | ICD-10-CM

## 2014-03-27 DIAGNOSIS — I10 Essential (primary) hypertension: Secondary | ICD-10-CM

## 2014-03-27 DIAGNOSIS — I259 Chronic ischemic heart disease, unspecified: Secondary | ICD-10-CM

## 2014-03-27 LAB — HEPATIC FUNCTION PANEL
ALBUMIN: 4.1 g/dL (ref 3.5–5.2)
ALK PHOS: 46 U/L (ref 39–117)
ALT: 20 U/L (ref 0–53)
AST: 28 U/L (ref 0–37)
Bilirubin, Direct: 0.3 mg/dL (ref 0.0–0.3)
Total Bilirubin: 1.2 mg/dL (ref 0.2–1.2)
Total Protein: 7 g/dL (ref 6.0–8.3)

## 2014-03-27 LAB — BASIC METABOLIC PANEL
BUN: 15 mg/dL (ref 6–23)
CALCIUM: 9.3 mg/dL (ref 8.4–10.5)
CO2: 28 meq/L (ref 19–32)
Chloride: 106 mEq/L (ref 96–112)
Creatinine, Ser: 0.91 mg/dL (ref 0.40–1.50)
GFR: 89.1 mL/min (ref >60.00)
Glucose, Bld: 104 mg/dL — ABNORMAL HIGH (ref 70–99)
Potassium: 4.1 mEq/L (ref 3.5–5.1)
Sodium: 138 mEq/L (ref 135–145)

## 2014-03-27 LAB — LIPID PANEL
Cholesterol: 165 mg/dL (ref 0–200)
HDL: 37.5 mg/dL — ABNORMAL LOW (ref >39.00)
LDL CALC: 102 mg/dL — AB (ref 0–99)
NONHDL: 127.5
TRIGLYCERIDES: 129 mg/dL (ref 0.0–149.0)
Total CHOL/HDL Ratio: 4
VLDL: 25.8 mg/dL (ref 0.0–40.0)

## 2014-03-27 MED ORDER — NITROGLYCERIN 0.4 MG SL SUBL: 0.4000 mg | SUBLINGUAL_TABLET | SUBLINGUAL | Status: AC | PRN

## 2014-03-27 MED ORDER — METOPROLOL SUCCINATE ER 100 MG PO TB24: ORAL_TABLET | ORAL | Status: DC

## 2014-03-27 NOTE — Patient Instructions (Signed)
Will obtain labs today and call you with the results (lp/bmet/hfp)  Your physician recommends that you continue on your current medications as directed. Please refer to the Current Medication list given to you today.  Your physician wants you to follow-up in: 6 months with fasting labs (lp/bmet/hfp) and ekg  You will receive a reminder letter in the mail two months in advance. If you don't receive a letter, please call our office to schedule the follow-up appointment.   Work harder on weight loss

## 2014-03-27 NOTE — Progress Notes (Signed)
Cardiology Office Note   Date:  03/27/2014   ID:  Lucas Buckley, DOB 1950/04/07, MRN [id number]  PCP:  Darlin Coco, MD  Cardiologist:   Darlin Coco, MD   No chief complaint on file.     History of Present Illness: Lucas Buckley is a 64 y.o. male who presents for a six-month follow-up office visit.  This pleasant middle-aged gentleman is seen for a scheduled followup office visit. The patient has a history of known ischemic heart disease. In 1999 he had stents to his right coronary artery and to his circumflex by Dr. Martinique. The patient has a history of IHSS. He also has a history of hypercholesterolemia and borderline glucose intolerance and exogenous obesity. In February 2013 he underwent robotic surgery on his prostate for prostate cancer by Dr. [name]. Since last visit he has been feeling well with no new cardiac symptoms.  No chest pain or shortness of breath.  No dizziness or syncope.  No palpitations.  He took a bad fall at a bowling alley in August and her low back.  X-rays did not show any fracture.  Only now is the patient  getting back into regular exercise.  He is disappointed by his weight gain since last visit.  He is considering Weight Watchers.  Past Medical History  Diagnosis Date  . PONV (postoperative nausea and vomiting)   . Hypercholesterolemia   . GERD (gastroesophageal reflux disease)   . Cancer     basal cell/ left cheek/    PROSTATE  . Coronary artery disease     ischemic heart disease-  stents x 2- 1999--LOV with clearance and note [name], EKG in [facility]  . H/O hiatal hernia   . Renal stones   . Arthritis     knee and sholders    Past Surgical History  Procedure Laterality Date  . Lithotripsy    . Cardiac catheterization      1999  . Coronary angioplasty    . Hernia repair      umbilical  . Testicular      tortion repair in teens  . Robot assisted laparoscopic radical prostatectomy  03/29/2011    Procedure: ROBOTIC ASSISTED  LAPAROSCOPIC RADICAL PROSTATECTOMY LEVEL 2;  Surgeon: [name], MD;  Location: [facility];  Service: Urology;  Laterality: N/A;  BILATERAL PELVIC LYMPHADENECTOMY      . Fracture surgery  1974    left ankle and lower leg     Current Outpatient Prescriptions  Medication Sig Dispense Refill  . CRESTOR 10 MG tablet TAKE 1 TABLET BY MOUTH DAILY 30 tablet 5  . metoprolol succinate (TOPROL-XL) 100 MG 24 hr tablet TAKE 1 TABLET BY MOUTH DAILY. 90 tablet 3  . tadalafil (CIALIS) 5 MG tablet Take 5 mg by mouth daily.    . nitroGLYCERIN (NITROSTAT) 0.4 MG SL tablet Place 1 tablet (0.4 mg total) under the tongue every 5 (five) minutes as needed for chest pain. 25 tablet prn   No current facility-administered medications for this visit.    Allergies:   Review of patient's allergies indicates no known allergies.    Social History:  The patient  reports that he has never smoked. He has never used smokeless tobacco. He reports that he does not drink alcohol or use illicit drugs.   Family History:  The patient's family history is negative for cardiomyopathy   ROS:  Please see the history of present illness.   Otherwise, review of systems are positive for  none.   All other systems are reviewed and negative.    PHYSICAL EXAM: VS:  BP 150/80 mmHg  Pulse 52  Ht 6\' 1"  (1.854 m)  Wt 275 lb (124.739 kg)  BMI 36.29 kg/m2 , BMI Body mass index is 36.29 kg/(m^2). GEN: Well nourished, well developed, in no acute distress HEENT: normal Neck: no JVD, carotid bruits, or masses Cardiac: RRR; , rubs, or gallops,no edema.  Grade 2/6 systolic ejection murmur at the base.  Respiratory:  clear to auscultation bilaterally, normal work of breathing GI: soft, nontender, nondistended, + BS MS: no deformity or atrophy Skin: warm and dry, no rash Neuro:  Strength and sensation are intact Psych: euthymic mood, full affect   EKG:  EKG is not ordered today.    Recent Labs: 06/15/2013: ALT 25; BUN 16; Creatinine  0.9; Potassium 4.3; Sodium 138    Lipid Panel    Component Value Date/Time   CHOL 159 06/15/2013 1038   TRIG 109.0 06/15/2013 1038   HDL 34.90* 06/15/2013 1038   CHOLHDL 5 06/15/2013 1038   VLDL 21.8 06/15/2013 1038   LDLCALC 102* 06/15/2013 1038      Wt Readings from Last 3 Encounters:  03/27/14 275 lb (124.739 kg)  06/15/13 271 lb (122.925 kg)  08/07/12 265 lb 4.8 oz (120.339 kg)      Other studies Reviewed:    ASSESSMENT AND PLAN:  1.  HOCM 2.  Ischemic heart disease status post stents to right coronary artery and circumflex in 1999 3.  Hypercholesterolemia 4.  Obesity 5.  History of prostate cancer treated with robotic surgery with Dr. [name] 6.  Systolic hypertension which may be secondary to nonsteroidal anti-inflammatory drug Aleve.   Current medicines are reviewed at length with the patient today.  The patient has concerns regarding medicines.  The following changes have been made:  Because of his systolic hypertension, the patient will try to use acetaminophen rather than Aleve.  Labs/ tests ordered today include: Lipid panel hepatic function panel basal metabolic panel   Orders Placed This Encounter  Procedures  . EKG 12-Lead     Disposition:   FU with Dr. [name] in 6 months for office visit EKG lipid panel hepatic function panel and basal metabolic panel   Signed, Darlin Coco, MD  03/27/2014 2:43 PM    [facility] [place], [employer] [place] [employer], [place]  [address] Phone: [phone]; Fax: [phone]

## 2014-05-03 ENCOUNTER — Other Ambulatory Visit: Payer: Self-pay | Admitting: Dermatology

## 2014-06-06 ENCOUNTER — Other Ambulatory Visit: Payer: Self-pay | Admitting: Cardiology

## 2014-12-10 ENCOUNTER — Other Ambulatory Visit: Payer: Self-pay | Admitting: Cardiology

## 2015-01-11 ENCOUNTER — Other Ambulatory Visit: Payer: Self-pay | Admitting: Cardiology

## 2015-02-10 ENCOUNTER — Other Ambulatory Visit: Payer: Self-pay | Admitting: Cardiology

## 2015-02-27 ENCOUNTER — Other Ambulatory Visit: Payer: Self-pay | Admitting: Cardiology

## 2015-03-24 ENCOUNTER — Telehealth: Payer: Self-pay | Admitting: Cardiology

## 2015-03-24 NOTE — Telephone Encounter (Signed)
Called pt and left message asking for pt to give our office a call back to update Fm and medical Hx.

## 2015-03-28 NOTE — Telephone Encounter (Signed)
Closed encounter °

## 2015-04-02 ENCOUNTER — Ambulatory Visit (INDEPENDENT_AMBULATORY_CARE_PROVIDER_SITE_OTHER): Payer: PPO | Admitting: Cardiology

## 2015-04-02 ENCOUNTER — Encounter: Payer: Self-pay | Admitting: Cardiology

## 2015-04-02 VITALS — BP 140/88 | HR 52 | Ht 73.0 in | Wt 256.8 lb

## 2015-04-02 DIAGNOSIS — I259 Chronic ischemic heart disease, unspecified: Secondary | ICD-10-CM | POA: Diagnosis not present

## 2015-04-02 DIAGNOSIS — E7800 Pure hypercholesterolemia, unspecified: Secondary | ICD-10-CM

## 2015-04-02 DIAGNOSIS — I421 Obstructive hypertrophic cardiomyopathy: Secondary | ICD-10-CM | POA: Diagnosis not present

## 2015-04-02 LAB — BASIC METABOLIC PANEL
BUN: 16 mg/dL (ref 7–25)
CHLORIDE: 103 mmol/L (ref 98–110)
CO2: 24 mmol/L (ref 20–31)
CREATININE: 0.94 mg/dL (ref 0.70–1.25)
Calcium: 9.4 mg/dL (ref 8.6–10.3)
Glucose, Bld: 106 mg/dL — ABNORMAL HIGH (ref 65–99)
POTASSIUM: 4.1 mmol/L (ref 3.5–5.3)
Sodium: 139 mmol/L (ref 135–146)

## 2015-04-02 LAB — HEPATIC FUNCTION PANEL
ALBUMIN: 4 g/dL (ref 3.6–5.1)
ALT: 26 U/L (ref 9–46)
AST: 34 U/L (ref 10–35)
Alkaline Phosphatase: 38 U/L — ABNORMAL LOW (ref 40–115)
BILIRUBIN TOTAL: 1.1 mg/dL (ref 0.2–1.2)
Bilirubin, Direct: 0.3 mg/dL — ABNORMAL HIGH (ref <=0.2)
Indirect Bilirubin: 0.8 mg/dL (ref 0.2–1.2)
Total Protein: 7.4 g/dL (ref 6.1–8.1)

## 2015-04-02 LAB — LIPID PANEL
CHOL/HDL RATIO: 4.5 ratio (ref <=5.0)
Cholesterol: 149 mg/dL (ref 125–200)
HDL: 33 mg/dL — AB (ref >=40)
LDL CALC: 82 mg/dL (ref <130)
TRIGLYCERIDES: 168 mg/dL — AB (ref <150)
VLDL: 34 mg/dL — AB (ref <30)

## 2015-04-02 NOTE — Progress Notes (Signed)
Cardiology Office Note   Date:  04/02/2015   ID:  Lucas Buckley, DOB April 14, 1950, MRN [id number]  PCP:  Warren Danes, MD  Cardiologist: Darlin Coco MD  No chief complaint on file.     History of Present Illness: Lucas Buckley is a 65 y.o. male who presents for One-year follow-up visit This pleasant middle-aged gentleman is seen for a scheduled followup office visit. The patient has a history of known ischemic heart disease. In 1999 he had stents to his right coronary artery and to his circumflex by Dr. Martinique. The patient has a history of IHSS. He also has a history of hypercholesterolemia and borderline glucose intolerance and exogenous obesity. In February 2013 he underwent robotic surgery on his prostate for prostate cancer by Dr. [name]. Since last visit he has been feeling well with no new cardiac symptoms. No chest pain or shortness of breath. No dizziness or syncope. No palpitations. He took a bad fall at a bowling alley in August2015 and  low back X-rays did not show any fracture.He still is not able to swing a golf club and he has difficulty reaching up over his head or picking things up off the floor.  He intends to go back to the orthopedists for a reassessment.  He had initially been told that it was just a muscle sprain, but that was more than a year and a half ago. The patient had plastic surgery on his nose for removal of a basal cell skin cancer Since last visit the patient has lost 19 pounds intentionally.  He has omitted bread from his diet and is watching his diet.  He tries to walk 10,000 steps per day and he has a fit bit on his wrist.   Past Medical History  Diagnosis Date  . PONV (postoperative nausea and vomiting)   . Hypercholesterolemia   . GERD (gastroesophageal reflux disease)   . Cancer ([place])     basal cell/ left cheek/    PROSTATE  . Coronary artery disease     ischemic heart disease-  stents x 2- 1999--LOV with clearance and note Dr  [name], EKG in [facility]  . H/O hiatal hernia   . Renal stones   . Arthritis     knee and sholders    Past Surgical History  Procedure Laterality Date  . Lithotripsy    . Cardiac catheterization      1999  . Coronary angioplasty    . Hernia repair      umbilical  . Testicular      tortion repair in teens  . Robot assisted laparoscopic radical prostatectomy  03/29/2011    Procedure: ROBOTIC ASSISTED LAPAROSCOPIC RADICAL PROSTATECTOMY LEVEL 2;  Surgeon: [name], MD;  Location: [facility];  Service: Urology;  Laterality: N/A;  BILATERAL PELVIC LYMPHADENECTOMY      . Fracture surgery  1974    left ankle and lower leg     Current Outpatient Prescriptions  Medication Sig Dispense Refill  . aspirin 325 MG tablet Take 325 mg by mouth daily.    . metoprolol succinate (TOPROL-XL) 100 MG 24 hr tablet TAKE 1 TABLET BY MOUTH DAILY. 90 tablet 3  . nitroGLYCERIN (NITROSTAT) 0.4 MG SL tablet Place 1 tablet (0.4 mg total) under the tongue every 5 (five) minutes as needed for chest pain. 25 tablet prn  . rosuvastatin (CRESTOR) 10 MG tablet TAKE 1 TABLET BY MOUTH EVERY DAY 30 tablet 1   No current facility-administered medications for  this visit.    Allergies:   Review of patient's allergies indicates no known allergies.    Social History:  The patient  reports that he has never smoked. He has never used smokeless tobacco. He reports that he does not drink alcohol or use illicit drugs.   Family History:  The patient's family history includes Heart failure in his mother; Stroke in his father.    ROS:  Please see the history of present illness.   Otherwise, review of systems are positive for none.   All other systems are reviewed and negative.    PHYSICAL EXAM: VS:  BP 140/88 mmHg  Pulse 52  Ht 6\' 1"  (1.854 m)  Wt 256 lb 12.8 oz (116.484 kg)  BMI 33.89 kg/m2 , BMI Body mass index is 33.89 kg/(m^2). GEN: Well nourished, well developed, in no acute distress HEENT: normal Neck: no JVD,  carotid bruits, or masses Cardiac: RRR; There is a grade 2/6 harsh systolic ejection murmur at the base.  No diastolic murmur.  No rubs, or gallops,no edema  Respiratory:  clear to auscultation bilaterally, normal work of breathing GI: soft, nontender, nondistended, + BS MS: no deformity or atrophy Skin: warm and dry, no rash Neuro:  Strength and sensation are intact Psych: euthymic mood, full affect   EKG:  EKG is ordered today. The ekg ordered today demonstrates Sinus bradycardia at 53 bpm.  Left axis deviation.  Nonspecific ST-T wave changes.  Since prior tracing of 08/03/12, no significant change.   Recent Labs: No results found for requested labs within last 365 days.    Lipid Panel    Component Value Date/Time   CHOL 165 03/27/2014 1439   TRIG 129.0 03/27/2014 1439   HDL 37.50* 03/27/2014 1439   CHOLHDL 4 03/27/2014 1439   VLDL 25.8 03/27/2014 1439   LDLCALC 102* 03/27/2014 1439      Wt Readings from Last 3 Encounters:  04/02/15 256 lb 12.8 oz (116.484 kg)  03/27/14 275 lb (124.739 kg)  06/15/13 271 lb (122.925 kg)         ASSESSMENT AND PLAN:  1. HOCM 2. Ischemic heart disease status post stents to right coronary artery and circumflex in 1999 3. Hypercholesterolemia 4. Obesity,Improving on low carbohydrate diet 5. History of prostate cancer treated with robotic surgery with Dr. [name] 6. Persistent low back pain interfering with activities of daily living began after a fall in the summer of 2015.  He will reconsult his orthopedist   Current medicines are reviewed at length with the patient today.  The patient does not have concerns regarding medicines.  The following changes have been made:  no change  Labs/ tests ordered today include:   Orders Placed This Encounter  Procedures  . Lipid panel  . Hepatic function panel  . Basic metabolic panel  . EKG 12-Lead    Disposition: Continue current medication.  He takes a full aspirin each day to  help with his low back pain as well as for his coronary artery stents.  We are checking fasting lab work today.  He will return in one year for office visit and EKG with Dr. Peter Martinique  Signed, Darlin Coco MD 04/02/2015 1:05 PM    [facility], [place], [place]  [address] Phone: [phone]; Fax: [phone]

## 2015-04-02 NOTE — Patient Instructions (Signed)
Medication Instructions:  Your physician recommends that you continue on your current medications as directed. Please refer to the Current Medication list given to you today.  Labwork: Lp/bmet/hfp  Testing/Procedures: none  Follow-Up: Your physician wants you to follow-up in: 1 year ov/ekg with Dr Martinique at the [facility] will receive a reminder letter in the mail two months in advance. If you don't receive a letter, please call our office to schedule the follow-up appointment.  If you need a refill on your cardiac medications before your next appointment, please call your pharmacy.

## 2015-04-03 NOTE — Progress Notes (Signed)
Quick Note:  Please report to patient. The recent labs are stable. Continue same medication and careful diet.LDL is better. Triglycerides are slightly elevated at 168. Continue to watch carbohydrates and continue with weight loss. ______

## 2015-04-08 ENCOUNTER — Other Ambulatory Visit: Payer: Self-pay | Admitting: Cardiology

## 2015-04-14 ENCOUNTER — Other Ambulatory Visit: Payer: Self-pay

## 2015-04-14 MED ORDER — ROSUVASTATIN CALCIUM 10 MG PO TABS: 10.0000 mg | ORAL_TABLET | Freq: Every day | ORAL | Status: DC

## 2015-05-27 DIAGNOSIS — L812 Freckles: Secondary | ICD-10-CM | POA: Diagnosis not present

## 2015-05-27 DIAGNOSIS — Z85828 Personal history of other malignant neoplasm of skin: Secondary | ICD-10-CM | POA: Diagnosis not present

## 2015-05-27 DIAGNOSIS — L820 Inflamed seborrheic keratosis: Secondary | ICD-10-CM | POA: Diagnosis not present

## 2015-05-27 DIAGNOSIS — L821 Other seborrheic keratosis: Secondary | ICD-10-CM | POA: Diagnosis not present

## 2015-06-13 DIAGNOSIS — C61 Malignant neoplasm of prostate: Secondary | ICD-10-CM | POA: Diagnosis not present

## 2015-06-20 DIAGNOSIS — N200 Calculus of kidney: Secondary | ICD-10-CM | POA: Diagnosis not present

## 2015-06-20 DIAGNOSIS — N5201 Erectile dysfunction due to arterial insufficiency: Secondary | ICD-10-CM | POA: Diagnosis not present

## 2015-06-20 DIAGNOSIS — Z0000 Encounter for general adult medical examination without abnormal findings: Secondary | ICD-10-CM | POA: Diagnosis not present

## 2015-06-20 DIAGNOSIS — C61 Malignant neoplasm of prostate: Secondary | ICD-10-CM | POA: Diagnosis not present

## 2015-07-07 DIAGNOSIS — M545 Low back pain: Secondary | ICD-10-CM | POA: Diagnosis not present

## 2015-07-24 DIAGNOSIS — Z961 Presence of intraocular lens: Secondary | ICD-10-CM | POA: Diagnosis not present

## 2015-07-24 DIAGNOSIS — H53001 Unspecified amblyopia, right eye: Secondary | ICD-10-CM | POA: Diagnosis not present

## 2015-07-24 DIAGNOSIS — H10413 Chronic giant papillary conjunctivitis, bilateral: Secondary | ICD-10-CM | POA: Diagnosis not present

## 2015-07-24 DIAGNOSIS — H01029 Squamous blepharitis unspecified eye, unspecified eyelid: Secondary | ICD-10-CM | POA: Diagnosis not present

## 2015-07-24 DIAGNOSIS — H2512 Age-related nuclear cataract, left eye: Secondary | ICD-10-CM | POA: Diagnosis not present

## 2015-07-24 DIAGNOSIS — D3131 Benign neoplasm of right choroid: Secondary | ICD-10-CM | POA: Diagnosis not present

## 2015-12-30 DIAGNOSIS — N5201 Erectile dysfunction due to arterial insufficiency: Secondary | ICD-10-CM | POA: Diagnosis not present

## 2015-12-30 DIAGNOSIS — Z8546 Personal history of malignant neoplasm of prostate: Secondary | ICD-10-CM | POA: Diagnosis not present

## 2016-01-27 DIAGNOSIS — M545 Low back pain: Secondary | ICD-10-CM | POA: Diagnosis not present

## 2016-01-27 DIAGNOSIS — N200 Calculus of kidney: Secondary | ICD-10-CM | POA: Diagnosis not present

## 2016-01-27 DIAGNOSIS — R109 Unspecified abdominal pain: Secondary | ICD-10-CM | POA: Diagnosis not present

## 2016-03-29 ENCOUNTER — Other Ambulatory Visit: Payer: Self-pay | Admitting: *Deleted

## 2016-03-30 ENCOUNTER — Other Ambulatory Visit: Payer: Self-pay

## 2016-03-30 MED ORDER — METOPROLOL SUCCINATE ER 100 MG PO TB24: 100.0000 mg | ORAL_TABLET | Freq: Every day | ORAL | 0 refills | Status: DC

## 2016-03-30 MED ORDER — ROSUVASTATIN CALCIUM 10 MG PO TABS: 10.0000 mg | ORAL_TABLET | Freq: Every day | ORAL | 0 refills | Status: DC

## 2016-03-30 NOTE — Telephone Encounter (Signed)
Rx(s) sent to pharmacy electronically.  

## 2016-04-01 NOTE — Progress Notes (Signed)
Cardiology Office Note   Date:  04/02/2016   ID:  Lucas SCHOMBERG, DOB April 28, 1950, MRN [id number]  PCP:  Lucas Austria, MD  Cardiologist: Lucas Camey Martinique MD  Chief Complaint  Patient presents with  . New Patient (Initial Visit)    former [name] patient. saw Dr Buckley approx 25 years ago.  . Coronary Artery Disease      History of Present Illness: Lucas Buckley is a 66 y.o. male who presents for follow up of HOCM. He is a former patient of Dr. Mare Buckley.    The patient has a history of known ischemic heart disease. In 1999 he had stents to his right coronary artery and to his circumflex by Dr. Martinique. The patient has a history of HOCM. He also has a history of hypercholesterolemia and borderline glucose intolerance and exogenous obesity. In February 2013 he underwent robotic surgery on his prostate for prostate cancer by Dr. Alinda Buckley.  On follow up today he reports he is doing very well from a cardiac standpoint. He denies any chest pain or SOB. No edema or palpitations. He does walk regularly. He did suffer a back injury that has limited his playing golf. He notes that his BP has been running high. Last Myoview study was in 2007 and last Echo 2005.    Past Medical History:  Diagnosis Date  . Arthritis    knee and sholders  . Cancer ([place])    basal cell/ left cheek/    PROSTATE  . Coronary artery disease    ischemic heart disease-  stents x 2- 1999--LOV with clearance and note Dr Lucas Buckley, EKG in [facility]  . GERD (gastroesophageal reflux disease)   . H/O hiatal hernia   . Hypercholesterolemia   . PONV (postoperative nausea and vomiting)   . Renal stones     Past Surgical History:  Procedure Laterality Date  . CARDIAC CATHETERIZATION     1999  . CORONARY ANGIOPLASTY    . FRACTURE SURGERY  1974   left ankle and lower leg  . HERNIA REPAIR     umbilical  . LITHOTRIPSY    . ROBOT ASSISTED LAPAROSCOPIC RADICAL PROSTATECTOMY  03/29/2011   Procedure: ROBOTIC ASSISTED  LAPAROSCOPIC RADICAL PROSTATECTOMY LEVEL 2;  Surgeon: [name], MD;  Location: [facility];  Service: Urology;  Laterality: N/A;  BILATERAL PELVIC LYMPHADENECTOMY      . testicular     tortion repair in teens     Current Outpatient Prescriptions  Medication Sig Dispense Refill  . famotidine (PEPCID) 20 MG tablet Take 1 tablet by mouth as needed. With ibuprofen    . ibuprofen (ADVIL,MOTRIN) 800 MG tablet Take 1 tablet by mouth as needed.    . metoprolol succinate (TOPROL-XL) 100 MG 24 hr tablet Take 1 tablet (100 mg total) by mouth daily. Take with or immediately following a meal. 90 tablet 0  . nitroGLYCERIN (NITROSTAT) 0.4 MG SL tablet Place 1 tablet (0.4 mg total) under the tongue every 5 (five) minutes as needed for chest pain. 25 tablet prn  . rosuvastatin (CRESTOR) 10 MG tablet Take 1 tablet (10 mg total) by mouth daily. 90 tablet 0  . losartan (COZAAR) 50 MG tablet Take 1 tablet (50 mg total) by mouth daily. 90 tablet 3   Current Facility-Administered Medications  Medication Dose Route Frequency Provider Last Rate Last Dose  . aspirin chewable tablet 81 mg  81 mg Oral Once [name] M Martinique, MD        Allergies:  Patient has no known allergies.    Social History:  The patient  reports that he has never smoked. He has never used smokeless tobacco. He reports that he does not drink alcohol or use drugs.   Family History:  The patient's family history includes Heart failure in his mother; Stroke in his father.    ROS:  Please see the history of present illness.   Otherwise, review of systems are positive for none.   All other systems are reviewed and negative.    PHYSICAL EXAM: VS:  BP (!) 165/76 (BP Location: Left Arm)   Pulse (!) 54   Ht 6' 1.5" (1.867 m)   Wt 266 lb 12.8 oz (121 kg)   BMI 34.72 kg/m  , BMI Body mass index is 34.72 kg/m. GEN: Well nourished, obese, in no acute distress  HEENT: normal  Neck: no JVD, carotid bruits, or masses Cardiac: RRR; There is a grade  2/6 harsh systolic ejection murmur at the base.  No diastolic murmur.  No rubs, or gallops,no edema  Respiratory:  clear to auscultation bilaterally, normal work of breathing GI: soft, nontender, nondistended, + BS MS: no deformity or atrophy  Skin: warm and dry, no rash Neuro:  Strength and sensation are intact Psych: euthymic mood, full affect   EKG:  EKG is ordered today. The ekg ordered today demonstrates Sinus bradycardia at 54 bpm.  LAFB.  T wave inversion in lateral leads.I have personally reviewed and interpreted this study.   Lab Results  Component Value Date   WBC 6.8 03/23/2011   HGB 12.5 (L) 03/30/2011   HCT 35.8 (L) 03/30/2011   PLT 174 03/23/2011   GLUCOSE 106 (H) 04/02/2015   CHOL 149 04/02/2015   TRIG 168 (H) 04/02/2015   HDL 33 (L) 04/02/2015   LDLCALC 82 04/02/2015   ALT 26 04/02/2015   AST 34 04/02/2015   NA 139 04/02/2015   K 4.1 04/02/2015   CL 103 04/02/2015   CREATININE 0.94 04/02/2015   BUN 16 04/02/2015   CO2 24 04/02/2015     Wt Readings from Last 3 Encounters:  04/02/16 266 lb 12.8 oz (121 kg)  04/02/15 256 lb 12.8 oz (116.5 kg)  03/27/14 275 lb (124.7 kg)         ASSESSMENT AND PLAN:  1. HOCM. Patient has a murmur c/w some outflow gradient. Will update Echo now.  2. Ischemic heart disease status post stents to right coronary artery and circumflex in 1999. Last myoview in 2007. No active angina. Recommend reduce ASA to 81 mg daily. Will update a stress Myoview. 3. Hypercholesterolemia. On statin. Check fasting lab today. 4. Obesity, recommend low carbohydrate diet 5. History of prostate cancer treated with robotic surgery with Dr. Alinda Buckley 6. HTN poorly controlled. Continue metoprolol. Add losartan 50 mg daily.    Current medicines are reviewed at length with the patient today.  The patient does not have concerns regarding medicines.  The following changes have been made:  no change  Labs/ tests ordered today include:   Orders  Placed This Encounter  Procedures  . CBC w/Diff/Platelet  . Basic metabolic panel  . Lipid panel  . Hepatic function panel  . Myocardial Perfusion Imaging  . EKG 12-Lead  . ECHOCARDIOGRAM COMPLETE    Disposition: follow up in 6 months if above studies stable.   Signed, Trinia Georgi Martinique MD, [facility]   04/02/2016 1:14 PM    [place]

## 2016-04-02 ENCOUNTER — Ambulatory Visit (INDEPENDENT_AMBULATORY_CARE_PROVIDER_SITE_OTHER): Payer: PPO | Admitting: Cardiology

## 2016-04-02 ENCOUNTER — Encounter: Payer: Self-pay | Admitting: Cardiology

## 2016-04-02 VITALS — BP 165/76 | HR 54 | Ht 73.5 in | Wt 266.8 lb

## 2016-04-02 DIAGNOSIS — I422 Other hypertrophic cardiomyopathy: Secondary | ICD-10-CM

## 2016-04-02 DIAGNOSIS — I259 Chronic ischemic heart disease, unspecified: Secondary | ICD-10-CM

## 2016-04-02 DIAGNOSIS — I10 Essential (primary) hypertension: Secondary | ICD-10-CM | POA: Diagnosis not present

## 2016-04-02 DIAGNOSIS — E7800 Pure hypercholesterolemia, unspecified: Secondary | ICD-10-CM

## 2016-04-02 MED ORDER — ASPIRIN 81 MG PO CHEW: 81.0000 mg | CHEWABLE_TABLET | Freq: Once | ORAL | Status: DC

## 2016-04-02 MED ORDER — LOSARTAN POTASSIUM 50 MG PO TABS: 50.0000 mg | ORAL_TABLET | Freq: Every day | ORAL | 3 refills | Status: DC

## 2016-04-02 NOTE — Patient Instructions (Addendum)
We will schedule you for a nuclear stress test and Echocardiogram  We will check fasting lab work  Decrease ASA to 81 mg daily  We will add losartan 50 mg daily for blood pressure   I will see you in 6 months.

## 2016-04-06 ENCOUNTER — Other Ambulatory Visit: Payer: Self-pay

## 2016-04-06 MED ORDER — ROSUVASTATIN CALCIUM 10 MG PO TABS: 10.0000 mg | ORAL_TABLET | Freq: Every day | ORAL | 3 refills | Status: DC

## 2016-04-08 ENCOUNTER — Other Ambulatory Visit: Payer: Self-pay | Admitting: Cardiology

## 2016-04-08 MED ORDER — METOPROLOL SUCCINATE ER 100 MG PO TB24
100.0000 mg | ORAL_TABLET | Freq: Every day | ORAL | 3 refills | Status: DC
Start: 2016-04-08 — End: 2017-03-23

## 2016-04-08 NOTE — Telephone Encounter (Signed)
Rx(s) sent to pharmacy electronically.  

## 2016-04-08 NOTE — Telephone Encounter (Signed)
[facility] pharmacy requesting a refill on Metoprolol 100 mg tablet taken 1 tablet daily. This is a Dr. Martinique pt. Please advise

## 2016-04-09 ENCOUNTER — Telehealth (HOSPITAL_COMMUNITY): Payer: Self-pay

## 2016-04-09 NOTE — Telephone Encounter (Signed)
Encounter complete. 

## 2016-04-14 ENCOUNTER — Ambulatory Visit (HOSPITAL_COMMUNITY)
Admission: RE | Admit: 2016-04-14 | Discharge: 2016-04-14 | Disposition: A | Discharge status: Home / Self Care | Payer: PPO | Source: Ambulatory Visit | Attending: Cardiology | Admitting: Cardiology

## 2016-04-14 DIAGNOSIS — I259 Chronic ischemic heart disease, unspecified: Secondary | ICD-10-CM | POA: Diagnosis not present

## 2016-04-14 DIAGNOSIS — I10 Essential (primary) hypertension: Secondary | ICD-10-CM | POA: Diagnosis not present

## 2016-04-14 DIAGNOSIS — E7800 Pure hypercholesterolemia, unspecified: Secondary | ICD-10-CM | POA: Diagnosis not present

## 2016-04-14 DIAGNOSIS — I422 Other hypertrophic cardiomyopathy: Secondary | ICD-10-CM | POA: Diagnosis not present

## 2016-04-14 LAB — MYOCARDIAL PERFUSION IMAGING
CHL CUP MPHR: 154 {beats}/min
CHL CUP NUCLEAR SDS: 0
CHL CUP NUCLEAR SRS: 6
CHL CUP NUCLEAR SSS: 6
CSEPED: 6 min
CSEPEW: 6.7 METS
CSEPHR: 93 %
Exercise duration (sec): 0 s
LV dias vol: 155 mL (ref 62–150)
LV sys vol: 87 mL
NUC STRESS TID: 0.98
Peak HR: 144 {beats}/min
RPE: 18
Rest HR: 53 {beats}/min

## 2016-04-14 MED ORDER — TECHNETIUM TC 99M TETROFOSMIN IV KIT
9.9000 | PACK | Freq: Once | INTRAVENOUS | Status: AC | PRN
  Administered 2016-04-14: 9.9 via INTRAVENOUS
  Filled 2016-04-14: qty 10

## 2016-04-14 MED ORDER — TECHNETIUM TC 99M TETROFOSMIN IV KIT
30.2000 | PACK | Freq: Once | INTRAVENOUS | Status: AC | PRN
  Administered 2016-04-14: 30.2 via INTRAVENOUS
  Filled 2016-04-14: qty 31

## 2016-04-14 MED ORDER — TECHNETIUM TC 99M TETROFOSMIN IV KIT
30.2000 | PACK | Freq: Once | INTRAVENOUS | Status: DC | PRN
  Filled 2016-04-14: qty 31

## 2016-04-15 ENCOUNTER — Telehealth: Payer: Self-pay | Admitting: Cardiology

## 2016-04-15 NOTE — Telephone Encounter (Signed)
Spoke to patient. Result given . Verbalized understanding  

## 2016-04-15 NOTE — Telephone Encounter (Signed)
New message    Pt verbalized that he is returning call for rn   Results for stress test

## 2016-04-15 NOTE — Telephone Encounter (Signed)
-----   Message from Peter M Martinique, MD sent at 04/15/2016  7:06 AM EST ----- Myoview shows ? Small area of infarct vs artifact. No ischemia. Overall perfusion looks low risk. EF is down compared to old studies. Will await Echo results.  Peter Martinique MD, [facility]

## 2016-04-20 ENCOUNTER — Other Ambulatory Visit: Payer: Self-pay

## 2016-04-20 ENCOUNTER — Ambulatory Visit (HOSPITAL_COMMUNITY): Payer: PPO | Attending: Cardiovascular Disease

## 2016-04-20 ENCOUNTER — Other Ambulatory Visit: Payer: PPO | Admitting: *Deleted

## 2016-04-20 DIAGNOSIS — I10 Essential (primary) hypertension: Secondary | ICD-10-CM

## 2016-04-20 DIAGNOSIS — I259 Chronic ischemic heart disease, unspecified: Secondary | ICD-10-CM

## 2016-04-20 DIAGNOSIS — I340 Nonrheumatic mitral (valve) insufficiency: Secondary | ICD-10-CM | POA: Diagnosis not present

## 2016-04-20 DIAGNOSIS — I422 Other hypertrophic cardiomyopathy: Secondary | ICD-10-CM | POA: Diagnosis not present

## 2016-04-20 DIAGNOSIS — E7800 Pure hypercholesterolemia, unspecified: Secondary | ICD-10-CM

## 2016-04-20 DIAGNOSIS — I501 Left ventricular failure: Secondary | ICD-10-CM | POA: Insufficient documentation

## 2016-04-20 DIAGNOSIS — I350 Nonrheumatic aortic (valve) stenosis: Secondary | ICD-10-CM | POA: Insufficient documentation

## 2016-04-20 DIAGNOSIS — Z8546 Personal history of malignant neoplasm of prostate: Secondary | ICD-10-CM | POA: Diagnosis not present

## 2016-04-20 DIAGNOSIS — I421 Obstructive hypertrophic cardiomyopathy: Secondary | ICD-10-CM

## 2016-04-20 LAB — LIPID PANEL
CHOLESTEROL TOTAL: 165 mg/dL (ref 100–199)
Chol/HDL Ratio: 4.2 ratio units (ref 0.0–5.0)
HDL: 39 mg/dL — AB (ref >39)
LDL Calculated: 99 mg/dL (ref 0–99)
TRIGLYCERIDES: 133 mg/dL (ref 0–149)
VLDL Cholesterol Cal: 27 mg/dL (ref 5–40)

## 2016-04-20 LAB — CBC WITH DIFFERENTIAL/PLATELET
BASOS ABS: 0 10*3/uL (ref 0.0–0.2)
BASOS: 0 %
EOS (ABSOLUTE): 0.2 10*3/uL (ref 0.0–0.4)
Eos: 3 %
HEMOGLOBIN: 13.9 g/dL (ref 13.0–17.7)
Hematocrit: 39.8 % (ref 37.5–51.0)
IMMATURE GRANS (ABS): 0 10*3/uL (ref 0.0–0.1)
IMMATURE GRANULOCYTES: 0 %
Lymphocytes Absolute: 0.8 10*3/uL (ref 0.7–3.1)
Lymphs: 16 %
MCH: 30.7 pg (ref 26.6–33.0)
MCHC: 34.9 g/dL (ref 31.5–35.7)
MCV: 88 fL (ref 79–97)
MONOCYTES: 11 %
Monocytes Absolute: 0.6 10*3/uL (ref 0.1–0.9)
NEUTROS ABS: 3.6 10*3/uL (ref 1.4–7.0)
NEUTROS PCT: 70 %
Platelets: 131 10*3/uL — ABNORMAL LOW (ref 150–379)
RBC: 4.53 x10E6/uL (ref 4.14–5.80)
RDW: 13.6 % (ref 12.3–15.4)
WBC: 5.2 10*3/uL (ref 3.4–10.8)

## 2016-04-20 LAB — BASIC METABOLIC PANEL
BUN/Creatinine Ratio: 20 (ref 10–24)
BUN: 20 mg/dL (ref 8–27)
CALCIUM: 9.2 mg/dL (ref 8.6–10.2)
CHLORIDE: 104 mmol/L (ref 96–106)
CO2: 19 mmol/L (ref 18–29)
Creatinine, Ser: 1 mg/dL (ref 0.76–1.27)
GFR calc non Af Amer: 78 mL/min/{1.73_m2} (ref >59)
GFR, EST AFRICAN AMERICAN: 90 mL/min/{1.73_m2} (ref >59)
Glucose: 112 mg/dL — ABNORMAL HIGH (ref 65–99)
POTASSIUM: 4.1 mmol/L (ref 3.5–5.2)
Sodium: 142 mmol/L (ref 134–144)

## 2016-04-20 LAB — HEPATIC FUNCTION PANEL
ALK PHOS: 55 IU/L (ref 39–117)
ALT: 23 IU/L (ref 0–44)
AST: 34 IU/L (ref 0–40)
Albumin: 4.1 g/dL (ref 3.6–4.8)
Bilirubin Total: 0.7 mg/dL (ref 0.0–1.2)
Bilirubin, Direct: 0.18 mg/dL (ref 0.00–0.40)
TOTAL PROTEIN: 6.9 g/dL (ref 6.0–8.5)

## 2016-04-20 NOTE — Addendum Note (Signed)
Addended by: Eulis Foster on: 04/20/2016 11:12 AM   Modules accepted: Orders

## 2016-04-21 ENCOUNTER — Other Ambulatory Visit: Payer: Self-pay

## 2016-04-21 DIAGNOSIS — E7800 Pure hypercholesterolemia, unspecified: Secondary | ICD-10-CM

## 2016-04-21 DIAGNOSIS — I259 Chronic ischemic heart disease, unspecified: Secondary | ICD-10-CM

## 2016-04-21 MED ORDER — ROSUVASTATIN CALCIUM 20 MG PO TABS: 20.0000 mg | ORAL_TABLET | Freq: Every day | ORAL | 6 refills | Status: DC

## 2016-04-26 DIAGNOSIS — Z961 Presence of intraocular lens: Secondary | ICD-10-CM | POA: Diagnosis not present

## 2016-07-02 DIAGNOSIS — N5201 Erectile dysfunction due to arterial insufficiency: Secondary | ICD-10-CM | POA: Diagnosis not present

## 2016-07-02 DIAGNOSIS — Z8546 Personal history of malignant neoplasm of prostate: Secondary | ICD-10-CM | POA: Diagnosis not present

## 2016-10-09 NOTE — Progress Notes (Signed)
Cardiology Office Note   Date:  10/11/2016   ID:  Keilon, Ressel [birth year], MRN [id number]  PCP:  [name], MD  Cardiologist: Joshus Rogan Martinique MD  Chief Complaint  Patient presents with  . Coronary Artery Disease      History of Present Illness: Lucas Buckley is a 66 y.o. male who presents for follow up of HCM.    The patient has a history of known ischemic heart disease. In 1999 he had stents to his right coronary artery and to his circumflex. The patient has a history of HOCM. He also has a history of hypercholesterolemia and borderline glucose intolerance and exogenous obesity. He had a stress Myoview study in February 2018 showing a small fixed apical lateral defect. No ischemia. EF 44%. Echo was done showing severe concentric LVH. No outflow gradient. Mild AS. EF was normal.   On follow up today he reports he is doing very well from a cardiac standpoint. He denies any chest pain or SOB. No edema or palpitations. He does walk daily. He has lost 8 lbs. He does take ibuprofen once a day for back pain.   Past Medical History:  Diagnosis Date  . Arthritis    knee and sholders  . Cancer ([place])    basal cell/ left cheek/    PROSTATE  . Coronary artery disease    ischemic heart disease-  stents x 2- 1999--LOV with clearance and note [name], EKG in [facility]  . GERD (gastroesophageal reflux disease)   . H/O hiatal hernia   . Hypercholesterolemia   . PONV (postoperative nausea and vomiting)   . Renal stones     Past Surgical History:  Procedure Laterality Date  . CARDIAC CATHETERIZATION     1999  . CORONARY ANGIOPLASTY    . FRACTURE SURGERY  1974   left ankle and lower leg  . HERNIA REPAIR     umbilical  . LITHOTRIPSY    . ROBOT ASSISTED LAPAROSCOPIC RADICAL PROSTATECTOMY  03/29/2011   Procedure: ROBOTIC ASSISTED LAPAROSCOPIC RADICAL PROSTATECTOMY LEVEL 2;  Surgeon: [name], MD;  Location: [facility];  Service: Urology;  Laterality: N/A;  BILATERAL PELVIC  LYMPHADENECTOMY      . testicular     tortion repair in teens     Current Outpatient Prescriptions  Medication Sig Dispense Refill  . aspirin EC 81 MG tablet Take 81 mg by mouth 2 (two) times daily.    . famotidine (PEPCID) 20 MG tablet Take 1 tablet by mouth as needed. With ibuprofen    . ibuprofen (ADVIL,MOTRIN) 800 MG tablet Take 1 tablet by mouth as needed.    . metoprolol succinate (TOPROL-XL) 100 MG 24 hr tablet Take 1 tablet (100 mg total) by mouth daily. Take with or immediately following a meal. 90 tablet 3  . nitroGLYCERIN (NITROSTAT) 0.4 MG SL tablet Place 1 tablet (0.4 mg total) under the tongue every 5 (five) minutes as needed for chest pain. 25 tablet prn  . rosuvastatin (CRESTOR) 20 MG tablet Take 20 mg by mouth daily.    Marland Kitchen losartan (COZAAR) 100 MG tablet Take 1 tablet (100 mg total) by mouth daily. 90 tablet 3   No current facility-administered medications for this visit.     Allergies:   Patient has no known allergies.    Social History:  The patient  reports that he has never smoked. He has never used smokeless tobacco. He reports that he does not drink alcohol or use drugs.  Family History:  The patient's family history includes Heart failure in his mother; Stroke in his father.    ROS:  Please see the history of present illness.   Otherwise, review of systems are positive for none.   All other systems are reviewed and negative.    PHYSICAL EXAM: VS:  BP (!) 172/84   Pulse (!) 56   Ht 6\' 1"  (1.854 m)   Wt 259 lb (117.5 kg)   SpO2 97%   BMI 34.17 kg/m  , BMI Body mass index is 34.17 kg/m. GEN: Well nourished, obese, in no acute distress  HEENT: normal  Neck: no JVD, carotid bruits, or masses Cardiac: RRR; There is a grade 1/6 systolic ejection murmur at the base.  No diastolic murmur.  No rubs, or gallops,no edema  Respiratory:  clear to auscultation bilaterally, normal work of breathing GI: soft, nontender, nondistended, + BS MS: no deformity or  atrophy  Skin: warm and dry, no rash Neuro:  Strength and sensation are intact Psych: euthymic mood, full affect   EKG:  EKG is not ordered today.    Lab Results  Component Value Date   WBC 5.2 04/20/2016   HGB 13.9 04/20/2016   HCT 39.8 04/20/2016   PLT 131 (L) 04/20/2016   GLUCOSE 112 (H) 04/20/2016   CHOL 165 04/20/2016   TRIG 133 04/20/2016   HDL 39 (L) 04/20/2016   LDLCALC 99 04/20/2016   ALT 23 04/20/2016   AST 34 04/20/2016   NA 142 04/20/2016   K 4.1 04/20/2016   CL 104 04/20/2016   CREATININE 1.00 04/20/2016   BUN 20 04/20/2016   CO2 19 04/20/2016     Wt Readings from Last 3 Encounters:  10/11/16 259 lb (117.5 kg)  04/14/16 266 lb (120.7 kg)  04/02/16 266 lb 12.8 oz (121 kg)     Echo 04/20/16: Study Conclusions  - Left ventricle: The cavity size was normal. There was severe   concentric hypertrophy. Systolic function was normal. The   estimated ejection fraction was in the range of 55% to 60%. Wall   motion was normal; there were no regional wall motion   abnormalities. Doppler parameters are consistent with a   reversible restrictive pattern, indicative of decreased left   ventricular diastolic compliance and/or increased left atrial   pressure (grade 3 diastolic dysfunction). Doppler parameters are   consistent with high ventricular filling pressure. - Aortic valve: Valve mobility was restricted. There was mild   stenosis. There was no regurgitation. Peak velocity (S): 265   cm/s. Mean gradient (S): 14 mm Hg. Peak gradient (S): 28 mm Hg. - Mitral valve: Transvalvular velocity was within the normal range.   There was no evidence for stenosis. There was trivial   regurgitation. - Right ventricle: The cavity size was normal. Wall thickness was   normal. Systolic function was normal. - Atrial septum: No defect or patent foramen ovale was identified   by color flow Doppler. - Tricuspid valve: There was no regurgitation.  Myoview 04/14/16: Study  Highlights     The left ventricular ejection fraction is moderately decreased (30-44%).  Nuclear stress EF: 44%.  There was no ST segment deviation noted during stress.  Blood pressure demonstrated a hypertensive response to exercise.  This is an intermediate risk study.   1. EF 44%, diffuse hypokinesis.  2. Fixed, small, mild apical lateral perfusion defect.  Possible attenuation, but cannot rule out small area of infarction.  No ischemia.  3. Intermediate risk  study due to low EF.      ASSESSMENT AND PLAN:  1. HCM. Most recent Echo showed severe concentric LVH. No outflow gradient. He is asymptomatic.  2. Ischemic heart disease status post stents to right coronary artery and circumflex in 1999. Myoview in February showed no ischemia.  No active angina.  3. Hypercholesterolemia. On statin.  4. Obesity, recommend low carbohydrate diet 5. History of prostate cancer treated with robotic surgery with Dr. [name] 6. HTN still poorly controlled. Continue metoprolol. Will increase losartan to 100 mg daily. Monitor BP at home.   Current medicines are reviewed at length with the patient today.  The patient does not have concerns regarding medicines.  The following changes have been made: see above.  Labs/ tests ordered today include:   No orders of the defined types were placed in this encounter.   Disposition: follow up in 6 months with fasting lab.  Signed, Ileah Falkenstein Martinique MD, [facility]   10/11/2016 1:45 PM     [facility]

## 2016-10-11 ENCOUNTER — Ambulatory Visit (INDEPENDENT_AMBULATORY_CARE_PROVIDER_SITE_OTHER): Payer: PPO | Admitting: Cardiology

## 2016-10-11 ENCOUNTER — Encounter: Payer: Self-pay | Admitting: Cardiology

## 2016-10-11 VITALS — BP 172/84 | HR 56 | Ht 73.0 in | Wt 259.0 lb

## 2016-10-11 DIAGNOSIS — I422 Other hypertrophic cardiomyopathy: Secondary | ICD-10-CM | POA: Diagnosis not present

## 2016-10-11 DIAGNOSIS — I259 Chronic ischemic heart disease, unspecified: Secondary | ICD-10-CM

## 2016-10-11 DIAGNOSIS — I10 Essential (primary) hypertension: Secondary | ICD-10-CM

## 2016-10-11 DIAGNOSIS — E7800 Pure hypercholesterolemia, unspecified: Secondary | ICD-10-CM

## 2016-10-11 MED ORDER — LOSARTAN POTASSIUM 100 MG PO TABS: 100.0000 mg | ORAL_TABLET | Freq: Every day | ORAL | 3 refills | Status: DC

## 2016-10-11 NOTE — Patient Instructions (Signed)
We will increase losartan to 100 mg daily   Continue your other therapy  I will see you in 6 months with fasting labs.

## 2016-11-25 ENCOUNTER — Other Ambulatory Visit: Payer: Self-pay | Admitting: *Deleted

## 2016-11-25 NOTE — Progress Notes (Signed)
Opened in errr

## 2016-11-26 ENCOUNTER — Other Ambulatory Visit: Payer: Self-pay

## 2016-11-26 MED ORDER — ROSUVASTATIN CALCIUM 20 MG PO TABS: 20.0000 mg | ORAL_TABLET | Freq: Every day | ORAL | 3 refills | Status: DC

## 2016-12-17 DIAGNOSIS — M1711 Unilateral primary osteoarthritis, right knee: Secondary | ICD-10-CM | POA: Diagnosis not present

## 2017-02-02 DIAGNOSIS — R1084 Generalized abdominal pain: Secondary | ICD-10-CM | POA: Diagnosis not present

## 2017-02-02 DIAGNOSIS — R112 Nausea with vomiting, unspecified: Secondary | ICD-10-CM | POA: Diagnosis not present

## 2017-02-02 DIAGNOSIS — R109 Unspecified abdominal pain: Secondary | ICD-10-CM | POA: Diagnosis not present

## 2017-02-02 DIAGNOSIS — N201 Calculus of ureter: Secondary | ICD-10-CM | POA: Diagnosis not present

## 2017-02-02 DIAGNOSIS — R310 Gross hematuria: Secondary | ICD-10-CM | POA: Diagnosis not present

## 2017-02-17 DIAGNOSIS — N201 Calculus of ureter: Secondary | ICD-10-CM | POA: Diagnosis not present

## 2017-03-03 DIAGNOSIS — L821 Other seborrheic keratosis: Secondary | ICD-10-CM | POA: Diagnosis not present

## 2017-03-03 DIAGNOSIS — L820 Inflamed seborrheic keratosis: Secondary | ICD-10-CM | POA: Diagnosis not present

## 2017-03-03 DIAGNOSIS — D1801 Hemangioma of skin and subcutaneous tissue: Secondary | ICD-10-CM | POA: Diagnosis not present

## 2017-03-10 IMAGING — NM NM MISC PROCEDURE
6 series · 36 of 36 positions shown · non-contrast
Comparison: none

[Series 1: wbr rest · 6.40mm/px · 6 of 64 frames shown]
[frame 6/64]
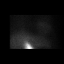
[frame 16/64]
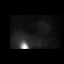
[frame 27/64]
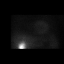
[frame 38/64]
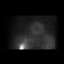
[frame 48/64]
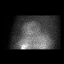
[frame 59/64]
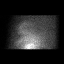

[Series 1: wbr_r-proj_st wbr rest · 6.40mm/px · 6 of 64 frames shown]
[frame 6/64]
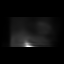
[frame 16/64]
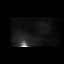
[frame 27/64]
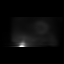
[frame 38/64]
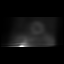
[frame 48/64]
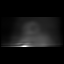
[frame 59/64]
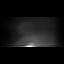

[Series 2: wbr stress-gsp · 6.40mm/px · 6 of 482 frames shown]
[frame 41/482  full-range]
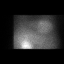
[frame 121/482  full-range]
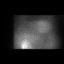
[frame 201/482  full-range]
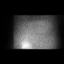
[frame 282/482  full-range]
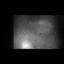
[frame 362/482  full-range]
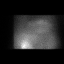
[frame 442/482  full-range]
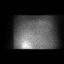

[Series 2: wbr_s-proj_st wbr stress-gsp · 6.40mm/px · 6 of 512 frames shown]
[frame 43/512]
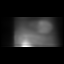
[frame 128/512]
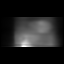
[frame 214/512]
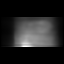
[frame 299/512]
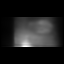
[frame 384/512]
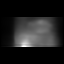
[frame 470/512]
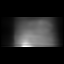

[Series 3: wbr stress-sum-em · 6.40mm/px · 6 of 64 frames shown]
[frame 6/64]
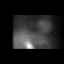
[frame 16/64]
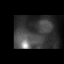
[frame 27/64]
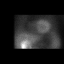
[frame 38/64]
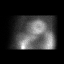
[frame 48/64]
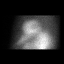
[frame 59/64]
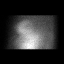

[Series 3: wbr_s-proj_st wbr stress-sum-em · 6.40mm/px · 6 of 64 frames shown]
[frame 6/64]
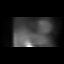
[frame 16/64]
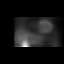
[frame 27/64]
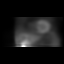
[frame 38/64]
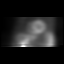
[frame 48/64]
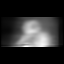
[frame 59/64]
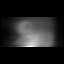

[36 of 36 positions shown; findings below may reference images not displayed]

Canned report from images found in remote index.

Refer to host system for actual result text.

## 2017-03-21 ENCOUNTER — Other Ambulatory Visit: Payer: Self-pay

## 2017-03-21 MED ORDER — LOSARTAN POTASSIUM 100 MG PO TABS: 100.0000 mg | ORAL_TABLET | Freq: Every day | ORAL | 3 refills | Status: DC

## 2017-03-21 NOTE — Telephone Encounter (Signed)
Rx(s) sent to pharmacy electronically.  

## 2017-03-23 ENCOUNTER — Other Ambulatory Visit: Payer: Self-pay

## 2017-03-23 MED ORDER — METOPROLOL SUCCINATE ER 100 MG PO TB24: 100.0000 mg | ORAL_TABLET | Freq: Every day | ORAL | 2 refills | Status: DC

## 2017-03-28 ENCOUNTER — Telehealth: Payer: Self-pay | Admitting: Cardiology

## 2017-03-28 MED ORDER — ROSUVASTATIN CALCIUM 20 MG PO TABS: 20.0000 mg | ORAL_TABLET | Freq: Every day | ORAL | 1 refills | Status: DC

## 2017-03-28 NOTE — Telephone Encounter (Signed)
Rx request sent to pharmacy.  

## 2017-04-20 DIAGNOSIS — H6122 Impacted cerumen, left ear: Secondary | ICD-10-CM | POA: Diagnosis not present

## 2017-06-21 ENCOUNTER — Other Ambulatory Visit: Payer: Self-pay | Admitting: *Deleted

## 2017-06-21 MED ORDER — METOPROLOL SUCCINATE ER 100 MG PO TB24: 100.0000 mg | ORAL_TABLET | Freq: Every day | ORAL | 0 refills | Status: DC

## 2017-06-22 DIAGNOSIS — Z87442 Personal history of urinary calculi: Secondary | ICD-10-CM | POA: Diagnosis not present

## 2017-06-22 DIAGNOSIS — N5201 Erectile dysfunction due to arterial insufficiency: Secondary | ICD-10-CM | POA: Diagnosis not present

## 2017-06-22 DIAGNOSIS — Z8546 Personal history of malignant neoplasm of prostate: Secondary | ICD-10-CM | POA: Diagnosis not present

## 2017-07-19 ENCOUNTER — Other Ambulatory Visit: Payer: Self-pay

## 2017-07-19 MED ORDER — METOPROLOL SUCCINATE ER 100 MG PO TB24: 100.0000 mg | ORAL_TABLET | Freq: Every day | ORAL | 3 refills | Status: DC

## 2017-10-17 DIAGNOSIS — L570 Actinic keratosis: Secondary | ICD-10-CM | POA: Diagnosis not present

## 2017-10-17 DIAGNOSIS — L821 Other seborrheic keratosis: Secondary | ICD-10-CM | POA: Diagnosis not present

## 2017-10-17 DIAGNOSIS — D485 Neoplasm of uncertain behavior of skin: Secondary | ICD-10-CM | POA: Diagnosis not present

## 2017-11-15 ENCOUNTER — Other Ambulatory Visit: Payer: Self-pay

## 2017-11-15 MED ORDER — METOPROLOL SUCCINATE ER 100 MG PO TB24: 100.0000 mg | ORAL_TABLET | Freq: Every day | ORAL | 0 refills | Status: DC

## 2017-11-16 DIAGNOSIS — H25812 Combined forms of age-related cataract, left eye: Secondary | ICD-10-CM | POA: Diagnosis not present

## 2017-11-16 DIAGNOSIS — H53001 Unspecified amblyopia, right eye: Secondary | ICD-10-CM | POA: Diagnosis not present

## 2017-11-16 DIAGNOSIS — D3131 Benign neoplasm of right choroid: Secondary | ICD-10-CM | POA: Diagnosis not present

## 2017-11-16 DIAGNOSIS — Z961 Presence of intraocular lens: Secondary | ICD-10-CM | POA: Diagnosis not present

## 2017-11-21 ENCOUNTER — Other Ambulatory Visit: Payer: Self-pay

## 2017-11-23 ENCOUNTER — Other Ambulatory Visit: Payer: Self-pay | Admitting: Cardiology

## 2017-11-23 ENCOUNTER — Other Ambulatory Visit: Payer: Self-pay

## 2017-11-23 MED ORDER — ROSUVASTATIN CALCIUM 20 MG PO TABS: 20.0000 mg | ORAL_TABLET | Freq: Every day | ORAL | 0 refills | Status: DC

## 2017-11-29 DIAGNOSIS — H25812 Combined forms of age-related cataract, left eye: Secondary | ICD-10-CM | POA: Diagnosis not present

## 2017-12-07 DIAGNOSIS — M79671 Pain in right foot: Secondary | ICD-10-CM | POA: Diagnosis not present

## 2017-12-12 DIAGNOSIS — H2512 Age-related nuclear cataract, left eye: Secondary | ICD-10-CM | POA: Diagnosis not present

## 2017-12-12 DIAGNOSIS — H25812 Combined forms of age-related cataract, left eye: Secondary | ICD-10-CM | POA: Diagnosis not present

## 2017-12-26 ENCOUNTER — Telehealth: Payer: Self-pay | Admitting: Cardiology

## 2017-12-26 ENCOUNTER — Other Ambulatory Visit: Payer: Self-pay | Admitting: Cardiology

## 2017-12-26 NOTE — Telephone Encounter (Signed)
°  Pt c/o of Chest Pain: STAT if CP now or developed within 24 hours  1. Are you having CP right now? Right side  2. Are you experiencing any other symptoms (ex. SOB, nausea, vomiting, sweating)? No other symptoms   3. How long have you been experiencing CP? Started about a week ago   4. Is your CP continuous or coming and going? Pain has increased as week has gone along. He is thinking it's a pulled muscle as it happened when he was working in the yard.   5. Have you taken Nitroglycerin? NO   ?

## 2017-12-26 NOTE — Telephone Encounter (Signed)
Spoke to patient. Patient states he walked about 5 miles today with no discomfort in chest .  Little if he takes Breathe.symtpoms do not mimick the symptoms when he had  stents in 1919.  Patient states he pushed an pull a riding lawn mower  about a week ago.   offered an appointment this week , patient decline states he would like to Take some Advil and rest the shoulder to see if that helps.  patient aware may call office back or follow up with primary

## 2018-02-02 DIAGNOSIS — Z961 Presence of intraocular lens: Secondary | ICD-10-CM | POA: Diagnosis not present

## 2018-02-21 ENCOUNTER — Other Ambulatory Visit: Payer: Self-pay | Admitting: *Deleted

## 2018-02-21 MED ORDER — ROSUVASTATIN CALCIUM 20 MG PO TABS: 20.0000 mg | ORAL_TABLET | Freq: Every day | ORAL | 0 refills | Status: DC

## 2018-02-22 ENCOUNTER — Other Ambulatory Visit: Payer: Self-pay | Admitting: Cardiology

## 2018-02-23 ENCOUNTER — Other Ambulatory Visit: Payer: Self-pay

## 2018-02-23 MED ORDER — ROSUVASTATIN CALCIUM 20 MG PO TABS: 20.0000 mg | ORAL_TABLET | Freq: Every day | ORAL | 0 refills | Status: DC

## 2018-03-03 DIAGNOSIS — L812 Freckles: Secondary | ICD-10-CM | POA: Diagnosis not present

## 2018-03-03 DIAGNOSIS — L820 Inflamed seborrheic keratosis: Secondary | ICD-10-CM | POA: Diagnosis not present

## 2018-03-03 DIAGNOSIS — L821 Other seborrheic keratosis: Secondary | ICD-10-CM | POA: Diagnosis not present

## 2018-03-06 NOTE — Progress Notes (Signed)
Cardiology Office Note   Date:  03/09/2018   ID:  Lucas Buckley, DOB [birth year], MRN [id number]  PCP:  [name], MD  Cardiologist: Jayelle Page Martinique MD  Chief Complaint  Patient presents with  . Coronary Artery Disease  . Hypertension      History of Present Illness: Lucas Buckley is a 68 y.o. male who presents for follow up of HCM and CAD.    The patient has a history of known ischemic heart disease. In 1999 he had stents to his right coronary artery and to his circumflex. The patient has a history of HOCM. He also has a history of hypercholesterolemia and borderline glucose intolerance and exogenous obesity. He had a stress Myoview study in February 2018 showing a small fixed apical lateral defect. No ischemia. EF 44%. Echo was done showing severe concentric LVH. No outflow gradient. Mild AS. EF was normal.   On follow up today he reports he is doing very well from a cardiac standpoint. He denies any chest pain or SOB. No edema or palpitations. He does walk  4 miles daily. His weight is down 16 lbs from our last visit. He had actually lost down to 225 lbs but then gained some back. He notes BP at home consistently 150-170 range.   Past Medical History:  Diagnosis Date  . Arthritis    knee and sholders  . Cancer ([place])    basal cell/ left cheek/    PROSTATE  . Coronary artery disease    ischemic heart disease-  stents x 2- 1999--LOV with clearance and note [name], EKG in [facility]  . GERD (gastroesophageal reflux disease)   . H/O hiatal hernia   . Hypercholesterolemia   . PONV (postoperative nausea and vomiting)   . Renal stones     Past Surgical History:  Procedure Laterality Date  . CARDIAC CATHETERIZATION     1999  . CORONARY ANGIOPLASTY    . FRACTURE SURGERY  1974   left ankle and lower leg  . HERNIA REPAIR     umbilical  . LITHOTRIPSY    . ROBOT ASSISTED LAPAROSCOPIC RADICAL PROSTATECTOMY  03/29/2011   Procedure: ROBOTIC ASSISTED LAPAROSCOPIC RADICAL  PROSTATECTOMY LEVEL 2;  Surgeon: [name], MD;  Location: [facility];  Service: Urology;  Laterality: N/A;  BILATERAL PELVIC LYMPHADENECTOMY      . testicular     tortion repair in teens     Current Outpatient Medications  Medication Sig Dispense Refill  . aspirin EC 81 MG tablet Take 81 mg by mouth 2 (two) times daily.    . famotidine (PEPCID) 20 MG tablet Take 1 tablet by mouth as needed. With ibuprofen    . ibuprofen (ADVIL,MOTRIN) 800 MG tablet Take 1 tablet by mouth as needed.    Marland Kitchen losartan (COZAAR) 100 MG tablet Take 1 tablet (100 mg total) by mouth daily. 90 tablet 3  . metoprolol succinate (TOPROL-XL) 100 MG 24 hr tablet Take 1 tablet (100 mg total) by mouth daily. NEED OV. 90 tablet 0  . nitroGLYCERIN (NITROSTAT) 0.4 MG SL tablet Place 1 tablet (0.4 mg total) under the tongue every 5 (five) minutes as needed for chest pain. 25 tablet prn  . rosuvastatin (CRESTOR) 20 MG tablet Take 1 tablet (20 mg total) by mouth daily. Please keep your upcoming appointment for any future refills. Thank you. 30 tablet 0  . chlorthalidone (HYGROTON) 25 MG tablet Take 1 tablet (25 mg total) by mouth daily. 90 tablet 3  No current facility-administered medications for this visit.     Allergies:   Patient has no known allergies.    Social History:  The patient  reports that he has never smoked. He has never used smokeless tobacco. He reports that he does not drink alcohol or use drugs.   Family History:  The patient's family history includes Heart failure in his mother; Stroke in his father.    ROS:  Please see the history of present illness.   Otherwise, review of systems are positive for none.   All other systems are reviewed and negative.    PHYSICAL EXAM: VS:  BP (!) 174/78   Pulse (!) 50   Ht 6' (1.829 m)   Wt 243 lb (110.2 kg)   BMI 32.96 kg/m  , BMI Body mass index is 32.96 kg/m. GENERAL:  Well appearing obese WM in NAD HEENT:  PERRL, EOMI, sclera are clear. Oropharynx is  clear. NECK:  No jugular venous distention, carotid upstroke brisk and symmetric, no bruits, no thyromegaly or adenopathy LUNGS:  Clear to auscultation bilaterally CHEST:  Unremarkable HEART:  RRR,  PMI not displaced or sustained,S1 and S2 within normal limits, no S3, no S4: no clicks, no rubs, soft 1/6 SEM ABD:  Soft, nontender. BS +, no masses or bruits. No hepatomegaly, no splenomegaly EXT:  2 + pulses throughout, no edema, no cyanosis no clubbing SKIN:  Warm and dry.  No rashes NEURO:  Alert and oriented x 3. Cranial nerves II through XII intact. PSYCH:  Cognitively intact     EKG:  EKG is  ordered today. It shows sinus brady rate 50. LAD, poor R wave progression. I have personally reviewed and interpreted this study.   Lab Results  Component Value Date   WBC 5.2 04/20/2016   HGB 13.9 04/20/2016   HCT 39.8 04/20/2016   PLT 131 (L) 04/20/2016   GLUCOSE 112 (H) 04/20/2016   CHOL 165 04/20/2016   TRIG 133 04/20/2016   HDL 39 (L) 04/20/2016   LDLCALC 99 04/20/2016   ALT 23 04/20/2016   AST 34 04/20/2016   NA 142 04/20/2016   K 4.1 04/20/2016   CL 104 04/20/2016   CREATININE 1.00 04/20/2016   BUN 20 04/20/2016   CO2 19 04/20/2016     Wt Readings from Last 3 Encounters:  03/09/18 243 lb (110.2 kg)  10/11/16 259 lb (117.5 kg)  04/14/16 266 lb (120.7 kg)     Echo 04/20/16: Study Conclusions  - Left ventricle: The cavity size was normal. There was severe   concentric hypertrophy. Systolic function was normal. The   estimated ejection fraction was in the range of 55% to 60%. Wall   motion was normal; there were no regional wall motion   abnormalities. Doppler parameters are consistent with a   reversible restrictive pattern, indicative of decreased left   ventricular diastolic compliance and/or increased left atrial   pressure (grade 3 diastolic dysfunction). Doppler parameters are   consistent with high ventricular filling pressure. - Aortic valve: Valve mobility  was restricted. There was mild   stenosis. There was no regurgitation. Peak velocity (S): 265   cm/s. Mean gradient (S): 14 mm Hg. Peak gradient (S): 28 mm Hg. - Mitral valve: Transvalvular velocity was within the normal range.   There was no evidence for stenosis. There was trivial   regurgitation. - Right ventricle: The cavity size was normal. Wall thickness was   normal. Systolic function was normal. - Atrial septum: No  defect or patent foramen ovale was identified   by color flow Doppler. - Tricuspid valve: There was no regurgitation.  Myoview 04/14/16: Study Highlights     The left ventricular ejection fraction is moderately decreased (30-44%).  Nuclear stress EF: 44%.  There was no ST segment deviation noted during stress.  Blood pressure demonstrated a hypertensive response to exercise.  This is an intermediate risk study.   1. EF 44%, diffuse hypokinesis.  2. Fixed, small, mild apical lateral perfusion defect.  Possible attenuation, but cannot rule out small area of infarction.  No ischemia.  3. Intermediate risk study due to low EF.      ASSESSMENT AND PLAN:  1. HCM. Most recent Echo showed severe concentric LVH. No outflow gradient. He is asymptomatic. Reports his children were screened early on and have had no problems. Will continue Toprol.  2. Ischemic heart disease status post stents to right coronary artery and circumflex in 1999. Myoview in February 2018 showed no ischemia.  No active angina. Continue ASA 3. Hypercholesterolemia. On statin. Will arrange for fasting labs.  4. Obesity, encouraged continued efforts at weight loss.  5. History of prostate cancer treated with robotic surgery with Dr. [name] 6.  HTN still poorly controlled. On Toprol XL and losartan. Cannot push beta blocker more due to brady. Will start chlorthalidone 25 mg daily. Will need to watch carefully with history of HCM but he has no outflow obstruction. Will check fasting lab in 2  weeks. Follow up in HTN clinic in 4 weeks. I will see in 6 months.    Current medicines are reviewed at length with the patient today.  The patient does not have concerns regarding medicines.  The following changes have been made: see above.  Labs/ tests ordered today include:   Orders Placed This Encounter  Procedures  . Comprehensive Metabolic Panel (CMET)  . Lipid panel  . CBC w/Diff/Platelet  . TSH  . EKG 12-Lead    Disposition: follow up in 6 months   Signed, Ustin Cruickshank Martinique MD, [facility]   03/09/2018 10:02 AM    [place]

## 2018-03-09 ENCOUNTER — Encounter (INDEPENDENT_AMBULATORY_CARE_PROVIDER_SITE_OTHER): Payer: Self-pay

## 2018-03-09 ENCOUNTER — Ambulatory Visit (INDEPENDENT_AMBULATORY_CARE_PROVIDER_SITE_OTHER): Payer: PPO | Admitting: Cardiology

## 2018-03-09 ENCOUNTER — Encounter: Payer: Self-pay | Admitting: Cardiology

## 2018-03-09 VITALS — BP 174/78 | HR 50 | Ht 72.0 in | Wt 243.0 lb

## 2018-03-09 DIAGNOSIS — E7800 Pure hypercholesterolemia, unspecified: Secondary | ICD-10-CM

## 2018-03-09 DIAGNOSIS — I10 Essential (primary) hypertension: Secondary | ICD-10-CM

## 2018-03-09 DIAGNOSIS — I2510 Atherosclerotic heart disease of native coronary artery without angina pectoris: Secondary | ICD-10-CM

## 2018-03-09 DIAGNOSIS — I422 Other hypertrophic cardiomyopathy: Secondary | ICD-10-CM | POA: Diagnosis not present

## 2018-03-09 MED ORDER — CHLORTHALIDONE 25 MG PO TABS: 25.0000 mg | ORAL_TABLET | Freq: Every day | ORAL | 3 refills | Status: DC

## 2018-03-09 NOTE — Patient Instructions (Signed)
We will add chlorthalidone 25 mg daily for blood pressure.  We will check blood work in 2 weeks  Follow up in our HTN clinic in 4 weeks.

## 2018-03-19 ENCOUNTER — Other Ambulatory Visit: Payer: Self-pay | Admitting: Cardiology

## 2018-03-20 ENCOUNTER — Other Ambulatory Visit: Payer: Self-pay

## 2018-03-20 MED ORDER — LOSARTAN POTASSIUM 100 MG PO TABS: 100.0000 mg | ORAL_TABLET | Freq: Every day | ORAL | 3 refills | Status: DC

## 2018-03-24 ENCOUNTER — Other Ambulatory Visit: Payer: Self-pay

## 2018-03-24 MED ORDER — ROSUVASTATIN CALCIUM 20 MG PO TABS: 20.0000 mg | ORAL_TABLET | Freq: Every day | ORAL | 3 refills | Status: DC

## 2018-03-24 NOTE — Telephone Encounter (Signed)
Rx(s) sent to pharmacy electronically.  

## 2018-03-31 DIAGNOSIS — E7800 Pure hypercholesterolemia, unspecified: Secondary | ICD-10-CM | POA: Diagnosis not present

## 2018-03-31 DIAGNOSIS — I422 Other hypertrophic cardiomyopathy: Secondary | ICD-10-CM | POA: Diagnosis not present

## 2018-03-31 DIAGNOSIS — I2510 Atherosclerotic heart disease of native coronary artery without angina pectoris: Secondary | ICD-10-CM | POA: Diagnosis not present

## 2018-03-31 DIAGNOSIS — I10 Essential (primary) hypertension: Secondary | ICD-10-CM | POA: Diagnosis not present

## 2018-04-01 LAB — COMPREHENSIVE METABOLIC PANEL
ALT: 28 IU/L (ref 0–44)
AST: 28 IU/L (ref 0–40)
Albumin/Globulin Ratio: 1.6 (ref 1.2–2.2)
Albumin: 4.3 g/dL (ref 3.8–4.8)
Alkaline Phosphatase: 58 IU/L (ref 39–117)
BILIRUBIN TOTAL: 0.8 mg/dL (ref 0.0–1.2)
BUN/Creatinine Ratio: 20 (ref 10–24)
BUN: 20 mg/dL (ref 8–27)
CO2: 23 mmol/L (ref 20–29)
Calcium: 9.4 mg/dL (ref 8.6–10.2)
Chloride: 99 mmol/L (ref 96–106)
Creatinine, Ser: 0.99 mg/dL (ref 0.76–1.27)
GFR calc Af Amer: 90 mL/min/{1.73_m2} (ref >59)
GFR calc non Af Amer: 78 mL/min/{1.73_m2} (ref >59)
Globulin, Total: 2.7 g/dL (ref 1.5–4.5)
Glucose: 116 mg/dL — ABNORMAL HIGH (ref 65–99)
Potassium: 4.3 mmol/L (ref 3.5–5.2)
Sodium: 138 mmol/L (ref 134–144)
TOTAL PROTEIN: 7 g/dL (ref 6.0–8.5)

## 2018-04-01 LAB — TSH: TSH: 6.21 u[IU]/mL — ABNORMAL HIGH (ref 0.450–4.500)

## 2018-04-01 LAB — CBC WITH DIFFERENTIAL/PLATELET
Basophils Absolute: 0.1 10*3/uL (ref 0.0–0.2)
Basos: 1 %
EOS (ABSOLUTE): 0.2 10*3/uL (ref 0.0–0.4)
EOS: 4 %
Hematocrit: 40.2 % (ref 37.5–51.0)
Hemoglobin: 14.3 g/dL (ref 13.0–17.7)
Immature Grans (Abs): 0 10*3/uL (ref 0.0–0.1)
Immature Granulocytes: 0 %
Lymphocytes Absolute: 0.9 10*3/uL (ref 0.7–3.1)
Lymphs: 14 %
MCH: 31.4 pg (ref 26.6–33.0)
MCHC: 35.6 g/dL (ref 31.5–35.7)
MCV: 88 fL (ref 79–97)
MONOS ABS: 0.8 10*3/uL (ref 0.1–0.9)
Monocytes: 12 %
Neutrophils Absolute: 4.6 10*3/uL (ref 1.4–7.0)
Neutrophils: 69 %
Platelets: 142 10*3/uL — ABNORMAL LOW (ref 150–450)
RBC: 4.56 x10E6/uL (ref 4.14–5.80)
RDW: 12.6 % (ref 11.6–15.4)
WBC: 6.6 10*3/uL (ref 3.4–10.8)

## 2018-04-01 LAB — LIPID PANEL
Chol/HDL Ratio: 3 ratio (ref 0.0–5.0)
Cholesterol, Total: 142 mg/dL (ref 100–199)
HDL: 47 mg/dL (ref >39)
LDL Calculated: 81 mg/dL (ref 0–99)
Triglycerides: 72 mg/dL (ref 0–149)
VLDL Cholesterol Cal: 14 mg/dL (ref 5–40)

## 2018-04-03 ENCOUNTER — Other Ambulatory Visit: Payer: Self-pay

## 2018-04-03 DIAGNOSIS — I422 Other hypertrophic cardiomyopathy: Secondary | ICD-10-CM

## 2018-04-03 DIAGNOSIS — E7800 Pure hypercholesterolemia, unspecified: Secondary | ICD-10-CM

## 2018-04-03 DIAGNOSIS — I259 Chronic ischemic heart disease, unspecified: Secondary | ICD-10-CM

## 2018-04-03 MED ORDER — ROSUVASTATIN CALCIUM 40 MG PO TABS: 40.0000 mg | ORAL_TABLET | Freq: Every day | ORAL | 6 refills | Status: DC

## 2018-04-04 DIAGNOSIS — D485 Neoplasm of uncertain behavior of skin: Secondary | ICD-10-CM | POA: Diagnosis not present

## 2018-04-04 DIAGNOSIS — L820 Inflamed seborrheic keratosis: Secondary | ICD-10-CM | POA: Diagnosis not present

## 2018-04-11 ENCOUNTER — Ambulatory Visit (INDEPENDENT_AMBULATORY_CARE_PROVIDER_SITE_OTHER): Payer: PPO | Admitting: Pharmacist

## 2018-04-11 VITALS — BP 148/72 | HR 50 | Resp 14 | Ht 73.0 in | Wt 240.0 lb

## 2018-04-11 DIAGNOSIS — I10 Essential (primary) hypertension: Secondary | ICD-10-CM

## 2018-04-11 MED ORDER — VALSARTAN 160 MG PO TABS: 160.0000 mg | ORAL_TABLET | Freq: Every day | ORAL | 1 refills | Status: DC

## 2018-04-11 NOTE — Progress Notes (Signed)
Patient ID: Lucas Buckley                 DOB: [birth year]                      MRN: [id number]     HPI: Lucas Buckley is a 67 y.o. male referred by Dr. Martinique to HTN clinic. PMH includes CAD s/p stent placement, hypercholesterolemia, GERD, hypertrophic cardiomyopathy, and uncontrolled hypertension. Chlorthalidone was added to his regimen by Dr Martinique during most recent [place]. Repeat BMET shows stable renal function and electrolytes.  Patient denies shortness or breath, swelling, headaches, or blurry vision. Reports some dizziness upon standing after prolong time sitting and noted he is a little bit more tired, but denies falls or change in normal activities.   Current HTN meds:  chlorthalidone 25mg  daily AM- started by Dr Martinique 03-09-2018 Losartan 100mg  daily - AM Metoprolol succinate 100mg  daily AM  BP goal: 130/80  Family History: The patient's family history includes Heart failure in his mother; stroke in his father.   Social History: The patient  reports that he has never smoked. He has never used smokeless tobacco. He reports that he does not drink alcohol or use drugs.   Diet: weekend eat out, home most other days  Exercise: walks about 1hr daily (3-7 days per week)  Home BP readings: none provided. Around 150s most time per patient recollection.   Wt Readings from Last 3 Encounters:  04/11/18 240 lb (108.9 kg)  03/09/18 243 lb (110.2 kg)  10/11/16 259 lb (117.5 kg)   BP Readings from Last 3 Encounters:  04/11/18 (!) 148/72  03/09/18 (!) 174/78  10/11/16 (!) 172/84   Pulse Readings from Last 3 Encounters:  04/11/18 (!) 50  03/09/18 (!) 50  10/11/16 (!) 56    Past Medical History:  Diagnosis Date  . Arthritis    knee and sholders  . Cancer ([place])    basal cell/ left cheek/    PROSTATE  . Coronary artery disease    ischemic heart disease-  stents x 2- 1999--LOV with clearance and note [name], [facility] in [facility]  . GERD (gastroesophageal reflux disease)   . H/O hiatal  hernia   . Hypercholesterolemia   . PONV (postoperative nausea and vomiting)   . Renal stones     Current Outpatient Medications on File Prior to Visit  Medication Sig Dispense Refill  . aspirin EC 81 MG tablet Take 81 mg by mouth 2 (two) times daily.    . chlorthalidone (HYGROTON) 25 MG tablet Take 1 tablet (25 mg total) by mouth daily. 90 tablet 3  . ibuprofen (ADVIL,MOTRIN) 800 MG tablet Take 1 tablet by mouth as needed.    . metoprolol succinate (TOPROL-XL) 100 MG 24 hr tablet TAKE 1 TABLET BY MOUTH EVERY DAY IMMEDIATLEY FOLLOWING A MEAL 30 tablet 6  . Multiple Vitamin (MULTIVITAMIN WITH MINERALS) TABS tablet Take 1 tablet by mouth daily.    . nitroGLYCERIN (NITROSTAT) 0.4 MG SL tablet Place 1 tablet (0.4 mg total) under the tongue every 5 (five) minutes as needed for chest pain. 25 tablet prn  . rosuvastatin (CRESTOR) 40 MG tablet Take 1 tablet (40 mg total) by mouth daily. 30 tablet 6  . famotidine (PEPCID) 20 MG tablet Take 1 tablet by mouth as needed. With ibuprofen     No current facility-administered medications on file prior to visit.     No Known Allergies  Blood pressure (!) 148/72,  pulse (!) 50, resp. rate 14, height 6\' 1"  (1.854 m), weight 240 lb (108.9 kg).  Essential hypertension Blood pressure remains above goal but much improved from last OV. Patient feels little bit tired, but otherwise tolerating current therapy. Will discontinue losartan 100mg  (last dose at the end of the month Feb-29) and start valsartan 160mg  daily on March-1. Plan to follow up in 5 weeks to determine medication titration is needed.   Karlisa Gaubert Rodriguez-Guzman PharmD, BCPS, [place] [id number] 04/11/2018 11:05 AM

## 2018-04-11 NOTE — Patient Instructions (Addendum)
Return for a  follow up appointment in 5 weeks  Check your blood pressure at home daily (if able) and keep record of the readings.  Take your BP meds as follows: *STOP taking losartan 100mg  (last dose February -29) *START taking valsartan 160mg  daily (1st dose March 1st) to replace losartan  Bring all of your meds, your BP cuff and your record of home blood pressures to your next appointment.  Exercise as you're able, try to walk approximately 30 minutes per day.  Keep salt intake to a minimum, especially watch canned and prepared boxed foods.  Eat more fresh fruits and vegetables and fewer canned items.  Avoid eating in fast food restaurants.    HOW TO TAKE YOUR BLOOD PRESSURE: . Rest 5 minutes before taking your blood pressure. .  Don't smoke or drink caffeinated beverages for at least 30 minutes before. . Take your blood pressure before (not after) you eat. . Sit comfortably with your back supported and both feet on the floor (don't cross your legs). . Elevate your arm to heart level on a table or a desk. . Use the proper sized cuff. It should fit smoothly and snugly around your bare upper arm. There should be enough room to slip a fingertip under the cuff. The bottom edge of the cuff should be 1 inch above the crease of the elbow. . Ideally, take 3 measurements at one sitting and record the average.

## 2018-04-11 NOTE — Assessment & Plan Note (Signed)
Blood pressure remains above goal but much improved from last OV. Patient feels little bit tired, but otherwise tolerating current therapy. Will discontinue losartan 100mg  (last dose at the end of the month Feb-29) and start valsartan 160mg  daily on March-1. Plan to follow up in 5 weeks to determine medication titration is needed.

## 2018-05-16 ENCOUNTER — Ambulatory Visit: Payer: PPO

## 2018-07-11 ENCOUNTER — Other Ambulatory Visit: Payer: Self-pay

## 2018-07-11 MED ORDER — VALSARTAN 160 MG PO TABS: 160.0000 mg | ORAL_TABLET | Freq: Every day | ORAL | 3 refills | Status: DC

## 2018-07-19 ENCOUNTER — Other Ambulatory Visit: Payer: Self-pay

## 2018-07-19 MED ORDER — VALSARTAN 160 MG PO TABS: 160.0000 mg | ORAL_TABLET | Freq: Every day | ORAL | 3 refills | Status: DC

## 2018-10-09 ENCOUNTER — Other Ambulatory Visit: Payer: Self-pay

## 2018-10-09 MED ORDER — VALSARTAN 160 MG PO TABS: 160.0000 mg | ORAL_TABLET | Freq: Every day | ORAL | 3 refills | Status: DC

## 2018-11-01 ENCOUNTER — Other Ambulatory Visit: Payer: Self-pay

## 2018-11-01 DIAGNOSIS — Z8546 Personal history of malignant neoplasm of prostate: Secondary | ICD-10-CM | POA: Diagnosis not present

## 2018-11-01 MED ORDER — ROSUVASTATIN CALCIUM 40 MG PO TABS: 40.0000 mg | ORAL_TABLET | Freq: Every day | ORAL | 0 refills | Status: DC

## 2018-11-06 ENCOUNTER — Other Ambulatory Visit: Payer: Self-pay

## 2018-11-08 DIAGNOSIS — N5201 Erectile dysfunction due to arterial insufficiency: Secondary | ICD-10-CM | POA: Diagnosis not present

## 2018-11-08 DIAGNOSIS — Z8546 Personal history of malignant neoplasm of prostate: Secondary | ICD-10-CM | POA: Diagnosis not present

## 2018-11-08 DIAGNOSIS — Z87442 Personal history of urinary calculi: Secondary | ICD-10-CM | POA: Diagnosis not present

## 2018-11-29 ENCOUNTER — Other Ambulatory Visit: Payer: Self-pay

## 2018-11-29 MED ORDER — ROSUVASTATIN CALCIUM 40 MG PO TABS: 40.0000 mg | ORAL_TABLET | Freq: Every day | ORAL | 1 refills | Status: DC

## 2019-01-02 ENCOUNTER — Other Ambulatory Visit: Payer: Self-pay | Admitting: Cardiology

## 2019-01-24 ENCOUNTER — Other Ambulatory Visit: Payer: Self-pay

## 2019-01-24 MED ORDER — ROSUVASTATIN CALCIUM 40 MG PO TABS: 40.0000 mg | ORAL_TABLET | Freq: Every day | ORAL | 1 refills | Status: DC

## 2019-02-22 ENCOUNTER — Other Ambulatory Visit: Payer: Self-pay

## 2019-02-22 MED ORDER — CHLORTHALIDONE 25 MG PO TABS: 25.0000 mg | ORAL_TABLET | Freq: Every day | ORAL | 3 refills | Status: DC

## 2019-02-26 ENCOUNTER — Other Ambulatory Visit: Payer: Self-pay

## 2019-02-26 MED ORDER — ROSUVASTATIN CALCIUM 40 MG PO TABS: 40.0000 mg | ORAL_TABLET | Freq: Every day | ORAL | 1 refills | Status: DC

## 2019-04-06 ENCOUNTER — Other Ambulatory Visit: Payer: Self-pay | Admitting: Cardiology

## 2019-05-08 ENCOUNTER — Other Ambulatory Visit: Payer: Self-pay | Admitting: Cardiology

## 2019-05-21 ENCOUNTER — Ambulatory Visit (INDEPENDENT_AMBULATORY_CARE_PROVIDER_SITE_OTHER): Payer: PPO | Admitting: Otolaryngology

## 2019-05-21 ENCOUNTER — Encounter (INDEPENDENT_AMBULATORY_CARE_PROVIDER_SITE_OTHER): Payer: Self-pay | Admitting: Otolaryngology

## 2019-05-21 ENCOUNTER — Other Ambulatory Visit: Payer: Self-pay

## 2019-05-21 VITALS — Temp 98.2°F

## 2019-05-21 DIAGNOSIS — H6123 Impacted cerumen, bilateral: Secondary | ICD-10-CM | POA: Diagnosis not present

## 2019-05-21 NOTE — Progress Notes (Signed)
HPI: Lucas Buckley is a 69 y.o. male who presents for evaluation of wax buildup in both ears.  He was a patient of Dr. Ernesto Rutherford who used to see him on a yearly basis to clean his ears.  Is been about 2 years since he has had his ears cleaned because of Covid.  He also complained of some neck pain which is probably more cervical in origin and recommended to follow-up with his orthopedic doctor concerning this..  Past Medical History:  Diagnosis Date  . Arthritis    knee and sholders  . Cancer ([place])    basal cell/ left cheek/    PROSTATE  . Coronary artery disease    ischemic heart disease-  stents x 2- 1999--LOV with clearance and note [name], EKG in [facility]  . GERD (gastroesophageal reflux disease)   . H/O hiatal hernia   . Hypercholesterolemia   . PONV (postoperative nausea and vomiting)   . Renal stones    Past Surgical History:  Procedure Laterality Date  . CARDIAC CATHETERIZATION     1999  . CORONARY ANGIOPLASTY    . FRACTURE SURGERY  1974   left ankle and lower leg  . HERNIA REPAIR     umbilical  . LITHOTRIPSY    . ROBOT ASSISTED LAPAROSCOPIC RADICAL PROSTATECTOMY  03/29/2011   Procedure: ROBOTIC ASSISTED LAPAROSCOPIC RADICAL PROSTATECTOMY LEVEL 2;  Surgeon: [name], MD;  Location: [facility];  Service: Urology;  Laterality: N/A;  BILATERAL PELVIC LYMPHADENECTOMY      . testicular     tortion repair in teens   Social History   Socioeconomic History  . Marital status: Married    Spouse name: Not on file  . Number of children: Not on file  . Years of education: Not on file  . Highest education level: Not on file  Occupational History  . Not on file  Tobacco Use  . Smoking status: Never Smoker  . Smokeless tobacco: Never Used  . Tobacco comment: social cigars or pipe  Substance and Sexual Activity  . Alcohol use: No    Comment: Rarely  . Drug use: No  . Sexual activity: Not on file  Other Topics Concern  . Not on file  Social History Narrative  . Not on  file   Social Determinants of Health   Financial Resource Strain:   . Difficulty of Paying Living Expenses:   Food Insecurity:   . Worried About Charity fundraiser in the Last Year:   . Arboriculturist in the Last Year:   Transportation Needs:   . Film/video editor (Medical):   Marland Kitchen Lack of Transportation (Non-Medical):   Physical Activity:   . Days of Exercise per Week:   . Minutes of Exercise per Session:   Stress:   . Feeling of Stress :   Social Connections:   . Frequency of Communication with Friends and Family:   . Frequency of Social Gatherings with Friends and Family:   . Attends Religious Services:   . Active Member of Clubs or Organizations:   . Attends Archivist Meetings:   Marland Kitchen Marital Status:    Family History  Problem Relation Age of Onset  . Heart failure Mother   . Stroke Father    No Known Allergies Prior to Admission medications   Medication Sig Start Date End Date Taking? Authorizing Provider  aspirin EC 81 MG tablet Take 81 mg by mouth 2 (two) times daily.  Yes [provider]  chlorthalidone (HYGROTON) 25 MG tablet Take 1 tablet (25 mg total) by mouth daily. 02/22/19 02/17/20 Yes Martinique, Peter M, MD  famotidine (PEPCID) 20 MG tablet Take 1 tablet by mouth as needed. With ibuprofen 03/31/16  Yes [provider]  ibuprofen (ADVIL,MOTRIN) 800 MG tablet Take 1 tablet by mouth as needed. 04/02/16  Yes [provider]  metoprolol succinate (TOPROL-XL) 100 MG 24 hr tablet TAKE 1 TABLET(100 MG) BY MOUTH DAILY 05/08/19  Yes Martinique, Peter M, MD  Multiple Vitamin (MULTIVITAMIN WITH MINERALS) TABS tablet Take 1 tablet by mouth daily.   Yes [provider]  nitroGLYCERIN (NITROSTAT) 0.4 MG SL tablet Place 1 tablet (0.4 mg total) under the tongue every 5 (five) minutes as needed for chest pain. 03/27/14  Yes Darlin Coco, MD  valsartan (DIOVAN) 160 MG tablet Take 1 tablet (160 mg total) by mouth daily. To replace losartan  10/09/18  Yes Martinique, Peter M, MD  rosuvastatin (CRESTOR) 40 MG tablet Take 1 tablet (40 mg total) by mouth daily. 02/26/19 03/28/19  Martinique, Peter M, MD     Positive ROS: Otherwise negative  All other systems have been reviewed and were otherwise negative with the exception of those mentioned in the HPI and as above.  Physical Exam: Constitutional: Alert, well-appearing, no acute distress Ears: External ears without lesions or tenderness. Ear canals he had a moderate amount of wax in both ear canals right side worse than the left.  This was cleaned with curettes.  TMs were clear bilaterally.. Nasal: External nose without lesions. Clear nasal passages Oral: Oropharynx clear. Neck: No palpable adenopathy or masses Respiratory: Breathing comfortably  Skin: No facial/neck lesions or rash noted.  Cerumen impaction removal  Date/Time: 05/21/2019 5:48 PM Performed by: Rozetta Nunnery, MD Authorized by: Rozetta Nunnery, MD   Consent:    Consent obtained:  Verbal   Consent given by:  Patient   Risks discussed:  Pain and bleeding Procedure details:    Location:  L ear and R ear   Procedure type: curette   Post-procedure details:    Inspection:  TM intact and canal normal   Hearing quality:  Improved   Patient tolerance of procedure:  Tolerated well, no immediate complications Comments:     TMs are clear bilaterally.    Assessment: Bilateral cerumen buildup  Plan: He will follow-up in every year or 2 to have his ears cleaned.  Radene Journey, MD

## 2019-05-30 DIAGNOSIS — L821 Other seborrheic keratosis: Secondary | ICD-10-CM | POA: Diagnosis not present

## 2019-05-30 DIAGNOSIS — D485 Neoplasm of uncertain behavior of skin: Secondary | ICD-10-CM | POA: Diagnosis not present

## 2019-05-30 DIAGNOSIS — L820 Inflamed seborrheic keratosis: Secondary | ICD-10-CM | POA: Diagnosis not present

## 2019-06-01 ENCOUNTER — Other Ambulatory Visit: Payer: Self-pay

## 2019-06-01 MED ORDER — ROSUVASTATIN CALCIUM 40 MG PO TABS: 40.0000 mg | ORAL_TABLET | Freq: Every day | ORAL | 0 refills | Status: DC

## 2019-06-04 ENCOUNTER — Other Ambulatory Visit: Payer: Self-pay

## 2019-06-21 DIAGNOSIS — H1045 Other chronic allergic conjunctivitis: Secondary | ICD-10-CM | POA: Diagnosis not present

## 2019-06-21 DIAGNOSIS — Z961 Presence of intraocular lens: Secondary | ICD-10-CM | POA: Diagnosis not present

## 2019-06-21 DIAGNOSIS — H11123 Conjunctival concretions, bilateral: Secondary | ICD-10-CM | POA: Diagnosis not present

## 2019-06-21 DIAGNOSIS — H02834 Dermatochalasis of left upper eyelid: Secondary | ICD-10-CM | POA: Diagnosis not present

## 2019-06-21 DIAGNOSIS — H04123 Dry eye syndrome of bilateral lacrimal glands: Secondary | ICD-10-CM | POA: Diagnosis not present

## 2019-06-21 DIAGNOSIS — H53021 Refractive amblyopia, right eye: Secondary | ICD-10-CM | POA: Diagnosis not present

## 2019-06-21 DIAGNOSIS — H02831 Dermatochalasis of right upper eyelid: Secondary | ICD-10-CM | POA: Diagnosis not present

## 2019-06-21 DIAGNOSIS — H26493 Other secondary cataract, bilateral: Secondary | ICD-10-CM | POA: Diagnosis not present

## 2019-07-18 ENCOUNTER — Other Ambulatory Visit: Payer: Self-pay | Admitting: Cardiology

## 2019-07-18 NOTE — Telephone Encounter (Signed)
New message    *STAT* If patient is at the pharmacy, call can be transferred to refill team.   1. Which medications need to be refilled? (please list name of each medication and dose if known) valsartan (DIOVAN) 160 MG tablet  2. Which pharmacy/location (including street and city if local pharmacy) is medication to be sent to?[id number] - [place], [place] - [address]  3. Do they need a 30 day or 90 day supply? [place]

## 2019-07-19 MED ORDER — VALSARTAN 160 MG PO TABS: 160.0000 mg | ORAL_TABLET | Freq: Every day | ORAL | 0 refills | Status: DC

## 2019-08-31 ENCOUNTER — Other Ambulatory Visit: Payer: Self-pay

## 2019-08-31 MED ORDER — ROSUVASTATIN CALCIUM 40 MG PO TABS: 40.0000 mg | ORAL_TABLET | Freq: Every day | ORAL | 1 refills | Status: DC

## 2019-10-14 ENCOUNTER — Other Ambulatory Visit: Payer: Self-pay | Admitting: Cardiology

## 2019-10-15 ENCOUNTER — Other Ambulatory Visit: Payer: Self-pay

## 2019-10-17 DIAGNOSIS — M542 Cervicalgia: Secondary | ICD-10-CM | POA: Diagnosis not present

## 2019-10-17 DIAGNOSIS — M79644 Pain in right finger(s): Secondary | ICD-10-CM | POA: Diagnosis not present

## 2019-10-23 ENCOUNTER — Other Ambulatory Visit: Payer: Self-pay

## 2019-10-23 ENCOUNTER — Ambulatory Visit (INDEPENDENT_AMBULATORY_CARE_PROVIDER_SITE_OTHER): Payer: PPO | Admitting: Otolaryngology

## 2019-10-23 ENCOUNTER — Encounter (INDEPENDENT_AMBULATORY_CARE_PROVIDER_SITE_OTHER): Payer: Self-pay | Admitting: Otolaryngology

## 2019-10-23 VITALS — Temp 97.7°F

## 2019-10-23 DIAGNOSIS — R490 Dysphonia: Secondary | ICD-10-CM | POA: Diagnosis not present

## 2019-10-23 DIAGNOSIS — K1120 Sialoadenitis, unspecified: Secondary | ICD-10-CM

## 2019-10-23 NOTE — Progress Notes (Signed)
HPI: Lucas Buckley is a 69 y.o. male who presents for evaluation of left neck gland and hoarseness.  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  He also has history of allergies and used to receive allergy shots with Dr. Ernesto Rutherford.  He is not taking any allergy medication presently and feels like this is contributing to his hoarseness.  His hoarseness becomes worse when he talks a lot or sings.  He has mild hoarseness in the office today.  He is also had a gland in the left neck that was swollen and painful but this is gone down it is not as painful today.  He saw Dr. Gladstone Lighter because of neck problems and neck pain which is related to arthritis and he recommended follow-up here concerning checking the left neck lymph node and hoarseness.  Past Medical History:  Diagnosis Date  . Arthritis    knee and sholders  . Cancer ([place])    basal cell/ left cheek/    PROSTATE  . Coronary artery disease    ischemic heart disease-  stents x 2- 1999--LOV with clearance and note [name], EKG in [facility]  . GERD (gastroesophageal reflux disease)   . H/O hiatal hernia   . Hypercholesterolemia   . PONV (postoperative nausea and vomiting)   . Renal stones    Past Surgical History:  Procedure Laterality Date  . CARDIAC CATHETERIZATION     1999  . CORONARY ANGIOPLASTY    . FRACTURE SURGERY  1974   left ankle and lower leg  . HERNIA REPAIR     umbilical  . LITHOTRIPSY    . ROBOT ASSISTED LAPAROSCOPIC RADICAL PROSTATECTOMY  03/29/2011   Procedure: ROBOTIC ASSISTED LAPAROSCOPIC RADICAL PROSTATECTOMY LEVEL 2;  Surgeon: [name], MD;  Location: [facility];  Service: Urology;  Laterality: N/A;  BILATERAL PELVIC LYMPHADENECTOMY      . testicular     tortion repair in teens   Social History   Socioeconomic History  . Marital status: Married    Spouse name: Not on file  . Number of children: Not on file  . Years of education: Not on file  . Highest education level: Not on file  Occupational  History  . Not on file  Tobacco Use  . Smoking status: Never Smoker  . Smokeless tobacco: Never Used  . Tobacco comment: social cigars or pipe  Substance and Sexual Activity  . Alcohol use: No    Comment: Rarely  . Drug use: No  . Sexual activity: Not on file  Other Topics Concern  . Not on file  Social History Narrative  . Not on file   Social Determinants of Health   Financial Resource Strain:   . Difficulty of Paying Living Expenses: Not on file  Food Insecurity:   . Worried About Charity fundraiser in the Last Year: Not on file  . Ran Out of Food in the Last Year: Not on file  Transportation Needs:   . Lack of Transportation (Medical): Not on file  . Lack of Transportation (Non-Medical): Not on file  Physical Activity:   . Days of Exercise per Week: Not on file  . Minutes of Exercise per Session: Not on file  Stress:   . Feeling of Stress : Not on file  Social Connections:   . Frequency of Communication with Friends and Family: Not on file  . Frequency of Social Gatherings with Friends and Family: Not on file  . Attends  Religious Services: Not on file  . Active Member of Clubs or Organizations: Not on file  . Attends Archivist Meetings: Not on file  . Marital Status: Not on file   Family History  Problem Relation Age of Onset  . Heart failure Mother   . Stroke Father    No Known Allergies Prior to Admission medications   Medication Sig Start Date End Date Taking? Authorizing Provider  aspirin EC 81 MG tablet Take 81 mg by mouth 2 (two) times daily.   Yes [provider]  chlorthalidone (HYGROTON) 25 MG tablet Take 1 tablet (25 mg total) by mouth daily. 02/22/19 02/17/20 Yes Martinique, Peter M, MD  famotidine (PEPCID) 20 MG tablet Take 1 tablet by mouth as needed. With ibuprofen 03/31/16  Yes [provider]  ibuprofen (ADVIL,MOTRIN) 800 MG tablet Take 1 tablet by mouth as needed. 04/02/16  Yes [provider]  metoprolol succinate  (TOPROL-XL) 100 MG 24 hr tablet TAKE 1 TABLET(100 MG) BY MOUTH DAILY 05/08/19  Yes Martinique, Peter M, MD  Multiple Vitamin (MULTIVITAMIN WITH MINERALS) TABS tablet Take 1 tablet by mouth daily.   Yes [provider]  nitroGLYCERIN (NITROSTAT) 0.4 MG SL tablet Place 1 tablet (0.4 mg total) under the tongue every 5 (five) minutes as needed for chest pain. 03/27/14  Yes Darlin Coco, MD  valsartan (DIOVAN) 160 MG tablet TAKE 1 TABLET BY MOUTH DAILY 10/15/19  Yes Martinique, Peter M, MD  rosuvastatin (CRESTOR) 40 MG tablet Take 1 tablet (40 mg total) by mouth daily. 08/31/19 09/30/19  Martinique, Peter M, MD     Positive ROS: Otherwise negative  All other systems have been reviewed and were otherwise negative with the exception of those mentioned in the HPI and as above.  Physical Exam: Constitutional: Alert, well-appearing, no acute distress.  Mild hoarseness. Ears: External ears without lesions or tenderness. Ear canals are clear bilaterally with intact, clear TMs.  Nasal: External nose without lesions. Septum slightly deviated to the right with mild rhinitis.  No clinical signs of infection.. Clear nasal passages otherwise. Oral: Lips and gums without lesions. Tongue and palate mucosa without lesions. Posterior oropharynx clear.  Indirect laryngoscopy was difficult because of strong gag reflex. Fiberoptic laryngoscopy was performed to the left nostril.  The left nasal cavity and left nasopharynx was clear.  The base of tongue vallecula and epiglottis were normal.  Vocal cords were were slightly edematous but had normal mobility and no vocal cord lesions nodules or polyps noted. Neck: No palpable adenopathy or masses.  He has slightly enlarged left semitubular gland compared to the right but this is not painful on palpation today and drainage from the submandibular duct on the left side was clear.  No significant palpable adenopathy noted. Respiratory: Breathing comfortably  Skin: No facial/neck  lesions or rash noted.  Laryngoscopy  Date/Time: 10/23/2019 2:04 PM Performed by: Rozetta Nunnery, MD Authorized by: Rozetta Nunnery, MD   Consent:    Consent obtained:  Verbal   Consent given by:  Patient Procedure details:    Indications: hoarseness, dysphagia, or aspiration     Medication:  Afrin   Instrument: flexible fiberoptic laryngoscope     Scope location: left nare   Sinus:    Left middle meatus: normal   Mouth:    Oropharynx: normal     Vallecula: normal     Base of tongue: normal     Epiglottis: normal   Throat:    True vocal cords:  normal   Comments:     Vocal cords with mild edema but clear otherwise with no specific vocal cord lesions.  Both cords had normal mobility.    Assessment: Resolved left submandibular sialoadenitis. Mild hoarseness with normal vocal cord examination on fiberoptic laryngoscopy.  Plan: Recommend use of allergy medications as this may help with the hoarseness.  Suggested use of Claritin Allegra or Zyrtec along with either Nasacort or Flonase 2 sprays each nostril at night.  If allergy problems persist consider further evaluation with allergist possibly next door with allergy and asthma.  Radene Journey, MD

## 2019-10-30 ENCOUNTER — Ambulatory Visit (INDEPENDENT_AMBULATORY_CARE_PROVIDER_SITE_OTHER): Payer: PPO | Admitting: Otolaryngology

## 2019-11-13 DIAGNOSIS — Z8546 Personal history of malignant neoplasm of prostate: Secondary | ICD-10-CM | POA: Diagnosis not present

## 2019-11-21 DIAGNOSIS — Z8546 Personal history of malignant neoplasm of prostate: Secondary | ICD-10-CM | POA: Diagnosis not present

## 2019-11-21 DIAGNOSIS — Z87442 Personal history of urinary calculi: Secondary | ICD-10-CM | POA: Diagnosis not present

## 2019-11-21 DIAGNOSIS — N5201 Erectile dysfunction due to arterial insufficiency: Secondary | ICD-10-CM | POA: Diagnosis not present

## 2019-12-21 ENCOUNTER — Telehealth: Payer: Self-pay | Admitting: Cardiology

## 2019-12-21 NOTE — Telephone Encounter (Signed)
Left a message for the patient to call back. He is past due for an appointment and will need one to evaluate his blood pressure.

## 2019-12-21 NOTE — Telephone Encounter (Signed)
*  STAT* If patient is at the pharmacy, call can be transferred to refill team.   1. Which medications need to be refilled? (please list name of each medication and dose if known) valsartan (DIOVAN) 160 MG tablet  2. Which pharmacy/location (including street and city if local pharmacy) is medication to be sent to?  [id number] - [place], [place] - [address] AT [address]  3. Do they need a 30 day or 90 day supply? [place]

## 2019-12-25 NOTE — Telephone Encounter (Signed)
[id number] APPT 

## 2019-12-26 NOTE — Telephone Encounter (Signed)
Lm to call back on home and mobile number ./cy

## 2019-12-27 ENCOUNTER — Telehealth: Payer: Self-pay | Admitting: Cardiology

## 2019-12-27 NOTE — Telephone Encounter (Signed)
Left message for patient to call and schedule appointment. Will remove from triage, this is the 4 th attempt to contact the patient.

## 2019-12-27 NOTE — Telephone Encounter (Signed)
    *  STAT* If patient is at the pharmacy, call can be transferred to refill team.   1. Which medications need to be refilled? (please list name of each medication and dose if known) valsartan (DIOVAN) 160 MG tablet  2. Which pharmacy/location (including street and city if local pharmacy) is medication to be sent to? [id number] - [place], [place] - [address]  3. Do they need a 30 day or 90 day supply? 90 days   Pt made an appt to see Dr. Martinique on 03/20/20

## 2019-12-28 ENCOUNTER — Other Ambulatory Visit: Payer: Self-pay

## 2019-12-28 MED ORDER — VALSARTAN 160 MG PO TABS: 160.0000 mg | ORAL_TABLET | Freq: Every day | ORAL | 0 refills | Status: DC

## 2020-02-28 ENCOUNTER — Other Ambulatory Visit: Payer: Self-pay

## 2020-02-28 ENCOUNTER — Other Ambulatory Visit: Payer: Self-pay | Admitting: Cardiology

## 2020-02-28 MED ORDER — CHLORTHALIDONE 25 MG PO TABS: 25.0000 mg | ORAL_TABLET | Freq: Every day | ORAL | 0 refills | Status: DC

## 2020-03-04 ENCOUNTER — Telehealth (INDEPENDENT_AMBULATORY_CARE_PROVIDER_SITE_OTHER): Payer: Self-pay

## 2020-03-17 NOTE — Progress Notes (Signed)
Cardiology Office Note   Date:  03/20/2020   ID:  Lucas Buckley, DOB 03-24-1950, MRN [id number]  PCP:  [name], MD  Cardiologist: Scarlet Abad Martinique MD  Chief Complaint  Patient presents with  . Follow-up    Follow-up Ov  . Coronary Artery Disease      History of Present Illness: Lucas Buckley is a 70 y.o. male who presents for follow up of HCM and CAD. Last seen 2 years ago.    The patient has a history of known ischemic heart disease. In 1999 he had stents to his right coronary artery and to his circumflex. The patient has a history of HOCM. He also has a history of hypercholesterolemia and borderline glucose intolerance and exogenous obesity. He had a stress Myoview study in February 2018 showing a small fixed apical lateral defect. No ischemia. EF 44%. Echo was done showing severe concentric LVH. No outflow gradient. Mild AS. EF was normal.   On follow up today he reports he is doing very well from a cardiac standpoint. He denies any chest pain or SOB. No edema or palpitations. He has gained some weight. He was having a lot of neck pain and was placed on Indocin with improvement. Has been taking this daily for a year. Reports BP at home is consistently less than 387 systolic.   Past Medical History:  Diagnosis Date  . Arthritis    knee and sholders  . Cancer ([place])    basal cell/ left cheek/    PROSTATE  . Coronary artery disease    ischemic heart disease-  stents x 2- 1999--LOV with clearance and note [name], [facility]KG in [facility]  . GERD (gastroesophageal reflux disease)   . H/O hiatal hernia   . Hypercholesterolemia   . PONV (postoperative nausea and vomiting)   . Renal stones     Past Surgical History:  Procedure Laterality Date  . CARDIAC CATHETERIZATION     1999  . CORONARY ANGIOPLASTY    . FRACTURE SURGERY  1974   left ankle and lower leg  . HERNIA REPAIR     umbilical  . LITHOTRIPSY    . ROBOT ASSISTED LAPAROSCOPIC RADICAL PROSTATECTOMY  03/29/2011    Procedure: ROBOTIC ASSISTED LAPAROSCOPIC RADICAL PROSTATECTOMY LEVEL 2;  Surgeon: [name], MD;  Location: [facility];  Service: Urology;  Laterality: N/A;  BILATERAL PELVIC LYMPHADENECTOMY      . testicular     tortion repair in teens     Current Outpatient Medications  Medication Sig Dispense Refill  . aspirin EC 81 MG tablet Take 81 mg by mouth 2 (two) times daily.    . famotidine (PEPCID) 20 MG tablet Take 1 tablet by mouth as needed. With ibuprofen    . indomethacin (INDOCIN) 25 MG capsule Take 25 mg by mouth 2 (two) times daily.    . Multiple Vitamin (MULTIVITAMIN WITH MINERALS) TABS tablet Take 1 tablet by mouth daily.    . nitroGLYCERIN (NITROSTAT) 0.4 MG SL tablet Place 1 tablet (0.4 mg total) under the tongue every 5 (five) minutes as needed for chest pain. 25 tablet prn  . chlorthalidone (HYGROTON) 25 MG tablet Take 1 tablet (25 mg total) by mouth daily. 90 tablet 3  . metoprolol succinate (TOPROL-XL) 100 MG 24 hr tablet Take 1 tablet (100 mg total) by mouth daily. Take with or immediately following a meal. 90 tablet 3  . rosuvastatin (CRESTOR) 40 MG tablet Take 1 tablet (40 mg total) by mouth  daily. 90 tablet 3  . valsartan (DIOVAN) 160 MG tablet Take 1 tablet (160 mg total) by mouth daily. 90 tablet 3   No current facility-administered medications for this visit.    Allergies:   Patient has no known allergies.    Social History:  The patient  reports that he has never smoked. He has never used smokeless tobacco. He reports that he does not drink alcohol and does not use drugs.   Family History:  The patient's family history includes Heart failure in his mother; Stroke in his father.    ROS:  Please see the history of present illness.   Otherwise, review of systems are positive for none.   All other systems are reviewed and negative.    PHYSICAL EXAM: VS:  BP (!) 155/70 (BP Location: Left Arm, Patient Position: Sitting)   Pulse (!) 50   Ht 6' (1.829 m)   Wt 264 lb  (119.7 kg)   SpO2 99%   BMI 35.80 kg/m  , BMI Body mass index is 35.8 kg/m. GENERAL:  Well appearing obese WM in NAD HEENT:  PERRL, EOMI, sclera are clear. Oropharynx is clear. NECK:  No jugular venous distention, carotid upstroke brisk and symmetric, no bruits, no thyromegaly or adenopathy LUNGS:  Clear to auscultation bilaterally CHEST:  Unremarkable HEART:  RRR,  PMI not displaced or sustained,S1 and S2 within normal limits, no S3, no S4: no clicks, no rubs, soft 1/6 SEM ABD:  Soft, nontender. BS +, no masses or bruits. No hepatomegaly, no splenomegaly EXT:  2 + pulses throughout, no edema, no cyanosis no clubbing SKIN:  Warm and dry.  No rashes NEURO:  Alert and oriented x 3. Cranial nerves II through XII intact. PSYCH:  Cognitively intact     EKG:  EKG is  ordered today. It shows sinus brady rate 50. LAD, poor R wave progression. First degree AV block.I have personally reviewed and interpreted this study.   Lab Results  Component Value Date   WBC 6.6 03/31/2018   HGB 14.3 03/31/2018   HCT 40.2 03/31/2018   PLT 142 (L) 03/31/2018   GLUCOSE 116 (H) 03/31/2018   CHOL 142 03/31/2018   TRIG 72 03/31/2018   HDL 47 03/31/2018   LDLCALC 81 03/31/2018   ALT 28 03/31/2018   AST 28 03/31/2018   NA 138 03/31/2018   K 4.3 03/31/2018   CL 99 03/31/2018   CREATININE 0.99 03/31/2018   BUN 20 03/31/2018   CO2 23 03/31/2018   TSH 6.210 (H) 03/31/2018     Wt Readings from Last 3 Encounters:  03/20/20 264 lb (119.7 kg)  04/11/18 240 lb (108.9 kg)  03/09/18 243 lb (110.2 kg)     Echo 04/20/16: Study Conclusions  - Left ventricle: The cavity size was normal. There was severe   concentric hypertrophy. Systolic function was normal. The   estimated ejection fraction was in the range of 55% to 60%. Wall   motion was normal; there were no regional wall motion   abnormalities. Doppler parameters are consistent with a   reversible restrictive pattern, indicative of decreased left    ventricular diastolic compliance and/or increased left atrial   pressure (grade 3 diastolic dysfunction). Doppler parameters are   consistent with high ventricular filling pressure. - Aortic valve: Valve mobility was restricted. There was mild   stenosis. There was no regurgitation. Peak velocity (S): 265   cm/s. Mean gradient (S): 14 mm Hg. Peak gradient (S): 28 mm Hg. -  Mitral valve: Transvalvular velocity was within the normal range.   There was no evidence for stenosis. There was trivial   regurgitation. - Right ventricle: The cavity size was normal. Wall thickness was   normal. Systolic function was normal. - Atrial septum: No defect or patent foramen ovale was identified   by color flow Doppler. - Tricuspid valve: There was no regurgitation.  Myoview 04/14/16: Study Highlights     The left ventricular ejection fraction is moderately decreased (30-44%).  Nuclear stress EF: 44%.  There was no ST segment deviation noted during stress.  Blood pressure demonstrated a hypertensive response to exercise.  This is an intermediate risk study.   1. EF 44%, diffuse hypokinesis.  2. Fixed, small, mild apical lateral perfusion defect.  Possible attenuation, but cannot rule out small area of infarction.  No ischemia.  3. Intermediate risk study due to low EF.      ASSESSMENT AND PLAN:  1. HCM. Most recent Echo showed severe concentric LVH. No outflow gradient. He is asymptomatic.  Will continue Toprol.   2. Ischemic heart disease status post stents to right coronary artery and circumflex in 1999. Myoview in February 2018 showed no ischemia.  No active angina. Continue ASA  3. Hypercholesterolemia. On statin. Will arrange for fasting labs.   4. Obesity, recommend weight loss and increased aerobic activity.  5. History of prostate cancer treated with robotic surgery with Dr. [name]  6.  HTN per home readings BP controlled on valsartan, chlorthalidone, and Toprol  7.  Cervical arthritis. I have asked him to take Indocin prn only. If he needs something daily I think it would be safer to switch to Celebrex. Should take an H2 blocker for GI protection.    Current medicines are reviewed at length with the patient today.  The patient does not have concerns regarding medicines.  The following changes have been made: see above.  Labs/ tests ordered today include:   Orders Placed This Encounter  Procedures  . EKG 12-Lead    Disposition: follow up in one year.  Signed, Janece Laidlaw Martinique MD, [facility]   03/20/2020 4:40 PM    [facility]

## 2020-03-18 ENCOUNTER — Other Ambulatory Visit: Payer: Self-pay | Admitting: Cardiology

## 2020-03-20 ENCOUNTER — Ambulatory Visit: Payer: PPO | Admitting: Cardiology

## 2020-03-20 ENCOUNTER — Encounter: Payer: Self-pay | Admitting: Cardiology

## 2020-03-20 ENCOUNTER — Other Ambulatory Visit: Payer: Self-pay

## 2020-03-20 VITALS — BP 155/70 | HR 50 | Ht 72.0 in | Wt 264.0 lb

## 2020-03-20 DIAGNOSIS — I259 Chronic ischemic heart disease, unspecified: Secondary | ICD-10-CM

## 2020-03-20 DIAGNOSIS — I10 Essential (primary) hypertension: Secondary | ICD-10-CM | POA: Diagnosis not present

## 2020-03-20 DIAGNOSIS — E7800 Pure hypercholesterolemia, unspecified: Secondary | ICD-10-CM | POA: Diagnosis not present

## 2020-03-20 DIAGNOSIS — I422 Other hypertrophic cardiomyopathy: Secondary | ICD-10-CM | POA: Diagnosis not present

## 2020-03-20 MED ORDER — CHLORTHALIDONE 25 MG PO TABS: 25.0000 mg | ORAL_TABLET | Freq: Every day | ORAL | 3 refills | Status: DC

## 2020-03-20 MED ORDER — ROSUVASTATIN CALCIUM 40 MG PO TABS: 40.0000 mg | ORAL_TABLET | Freq: Every day | ORAL | 3 refills | Status: DC

## 2020-03-20 MED ORDER — METOPROLOL SUCCINATE ER 100 MG PO TB24: 100.0000 mg | ORAL_TABLET | Freq: Every day | ORAL | 3 refills | Status: DC

## 2020-03-20 MED ORDER — VALSARTAN 160 MG PO TABS: 160.0000 mg | ORAL_TABLET | Freq: Every day | ORAL | 3 refills | Status: DC

## 2020-03-20 NOTE — Patient Instructions (Signed)
Try stopping Indocin and taking it only as needed. If you do need to take something regularly I would recommend switching to Celebrex.  We will check lab work today  Follow up in one year

## 2020-03-28 DIAGNOSIS — I422 Other hypertrophic cardiomyopathy: Secondary | ICD-10-CM | POA: Diagnosis not present

## 2020-03-28 DIAGNOSIS — I259 Chronic ischemic heart disease, unspecified: Secondary | ICD-10-CM | POA: Diagnosis not present

## 2020-03-28 DIAGNOSIS — I10 Essential (primary) hypertension: Secondary | ICD-10-CM | POA: Diagnosis not present

## 2020-03-28 DIAGNOSIS — E7800 Pure hypercholesterolemia, unspecified: Secondary | ICD-10-CM | POA: Diagnosis not present

## 2020-03-29 LAB — HEMOGLOBIN A1C
Est. average glucose Bld gHb Est-mCnc: 166 mg/dL
Hgb A1c MFr Bld: 7.4 % — ABNORMAL HIGH (ref 4.8–5.6)

## 2020-03-29 LAB — CBC WITH DIFFERENTIAL/PLATELET
Basophils Absolute: 0 10*3/uL (ref 0.0–0.2)
Basos: 0 %
EOS (ABSOLUTE): 0.2 10*3/uL (ref 0.0–0.4)
Eos: 4 %
Hematocrit: 41 % (ref 37.5–51.0)
Hemoglobin: 13.9 g/dL (ref 13.0–17.7)
Immature Grans (Abs): 0 10*3/uL (ref 0.0–0.1)
Immature Granulocytes: 0 %
Lymphocytes Absolute: 0.8 10*3/uL (ref 0.7–3.1)
Lymphs: 15 %
MCH: 31.3 pg (ref 26.6–33.0)
MCHC: 33.9 g/dL (ref 31.5–35.7)
MCV: 92 fL (ref 79–97)
Monocytes Absolute: 0.6 10*3/uL (ref 0.1–0.9)
Monocytes: 12 %
Neutrophils Absolute: 3.5 10*3/uL (ref 1.4–7.0)
Neutrophils: 69 %
Platelets: 140 10*3/uL — ABNORMAL LOW (ref 150–450)
RBC: 4.44 x10E6/uL (ref 4.14–5.80)
RDW: 14.2 % (ref 11.6–15.4)
WBC: 5.2 10*3/uL (ref 3.4–10.8)

## 2020-03-29 LAB — COMPREHENSIVE METABOLIC PANEL
ALT: 22 IU/L (ref 0–44)
AST: 36 IU/L (ref 0–40)
Albumin/Globulin Ratio: 1.3 (ref 1.2–2.2)
Albumin: 3.7 g/dL — ABNORMAL LOW (ref 3.8–4.8)
Alkaline Phosphatase: 59 IU/L (ref 44–121)
BUN/Creatinine Ratio: 15 (ref 10–24)
BUN: 20 mg/dL (ref 8–27)
Bilirubin Total: 0.7 mg/dL (ref 0.0–1.2)
CO2: 24 mmol/L (ref 20–29)
Calcium: 9.3 mg/dL (ref 8.6–10.2)
Chloride: 102 mmol/L (ref 96–106)
Creatinine, Ser: 1.3 mg/dL — ABNORMAL HIGH (ref 0.76–1.27)
GFR calc Af Amer: 64 mL/min/{1.73_m2} (ref >59)
GFR calc non Af Amer: 55 mL/min/{1.73_m2} — ABNORMAL LOW (ref >59)
Globulin, Total: 2.8 g/dL (ref 1.5–4.5)
Glucose: 138 mg/dL — ABNORMAL HIGH (ref 65–99)
Potassium: 3.9 mmol/L (ref 3.5–5.2)
Sodium: 136 mmol/L (ref 134–144)
Total Protein: 6.5 g/dL (ref 6.0–8.5)

## 2020-03-29 LAB — LIPID PANEL
Chol/HDL Ratio: 3.7 ratio (ref 0.0–5.0)
Cholesterol, Total: 139 mg/dL (ref 100–199)
HDL: 38 mg/dL — ABNORMAL LOW (ref >39)
LDL Chol Calc (NIH): 75 mg/dL (ref 0–99)
Triglycerides: 149 mg/dL (ref 0–149)
VLDL Cholesterol Cal: 26 mg/dL (ref 5–40)

## 2020-03-30 ENCOUNTER — Other Ambulatory Visit: Payer: Self-pay | Admitting: Cardiology

## 2020-03-31 ENCOUNTER — Other Ambulatory Visit: Payer: Self-pay

## 2020-05-03 ENCOUNTER — Other Ambulatory Visit: Payer: Self-pay | Admitting: Cardiology

## 2020-05-05 ENCOUNTER — Other Ambulatory Visit: Payer: Self-pay | Admitting: Cardiology

## 2020-05-08 DIAGNOSIS — L570 Actinic keratosis: Secondary | ICD-10-CM | POA: Diagnosis not present

## 2020-05-08 DIAGNOSIS — L820 Inflamed seborrheic keratosis: Secondary | ICD-10-CM | POA: Diagnosis not present

## 2020-05-08 DIAGNOSIS — L821 Other seborrheic keratosis: Secondary | ICD-10-CM | POA: Diagnosis not present

## 2020-05-08 DIAGNOSIS — D485 Neoplasm of uncertain behavior of skin: Secondary | ICD-10-CM | POA: Diagnosis not present

## 2020-05-29 ENCOUNTER — Other Ambulatory Visit: Payer: Self-pay | Admitting: Cardiology

## 2020-06-03 DIAGNOSIS — L820 Inflamed seborrheic keratosis: Secondary | ICD-10-CM | POA: Diagnosis not present

## 2020-06-03 DIAGNOSIS — L812 Freckles: Secondary | ICD-10-CM | POA: Diagnosis not present

## 2020-06-03 DIAGNOSIS — D225 Melanocytic nevi of trunk: Secondary | ICD-10-CM | POA: Diagnosis not present

## 2020-06-03 DIAGNOSIS — L821 Other seborrheic keratosis: Secondary | ICD-10-CM | POA: Diagnosis not present

## 2020-06-13 ENCOUNTER — Other Ambulatory Visit: Payer: Self-pay | Admitting: Cardiology

## 2020-06-13 DIAGNOSIS — I10 Essential (primary) hypertension: Secondary | ICD-10-CM

## 2020-11-21 DIAGNOSIS — Z8546 Personal history of malignant neoplasm of prostate: Secondary | ICD-10-CM | POA: Diagnosis not present

## 2020-11-28 DIAGNOSIS — Z8546 Personal history of malignant neoplasm of prostate: Secondary | ICD-10-CM | POA: Diagnosis not present

## 2020-11-28 DIAGNOSIS — N5201 Erectile dysfunction due to arterial insufficiency: Secondary | ICD-10-CM | POA: Diagnosis not present

## 2021-03-19 DIAGNOSIS — H43392 Other vitreous opacities, left eye: Secondary | ICD-10-CM | POA: Diagnosis not present

## 2021-03-19 DIAGNOSIS — H43811 Vitreous degeneration, right eye: Secondary | ICD-10-CM | POA: Diagnosis not present

## 2021-03-23 ENCOUNTER — Other Ambulatory Visit: Payer: Self-pay

## 2021-03-23 MED ORDER — CHLORTHALIDONE 25 MG PO TABS: ORAL_TABLET | ORAL | 0 refills | Status: DC

## 2021-04-07 DIAGNOSIS — H43392 Other vitreous opacities, left eye: Secondary | ICD-10-CM | POA: Diagnosis not present

## 2021-04-07 DIAGNOSIS — H43811 Vitreous degeneration, right eye: Secondary | ICD-10-CM | POA: Diagnosis not present

## 2021-04-12 NOTE — Progress Notes (Signed)
Cardiology Office Note   Date:  04/17/2021   ID:  Ulyess, Muto [birth year], MRN [id number]  PCP:  [name], MD  Cardiologist: Ziair Penson Martinique MD  Chief Complaint  Patient presents with   Cardiomyopathy      History of Present Illness: Lucas Buckley is a 71 y.o. male who presents for follow up of HCM and CAD. Last seen 2 years ago.    The patient has a history of known ischemic heart disease.  In 1999 he had stents to his right coronary artery and to his circumflex. The patient has a history of HOCM.  He also has a history of hypercholesterolemia and borderline glucose intolerance and exogenous obesity.  He had a stress Myoview study in February 2018 showing a small fixed apical lateral defect. No ischemia. EF 44%. Echo was done showing severe concentric LVH. No outflow gradient. Mild AS. EF was normal.   On follow up today he reports he is doing very well from a cardiac standpoint. He denies any chest pain or SOB. No edema or palpitations. He has gained some weight. 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   Past Medical History:  Diagnosis Date   Arthritis    knee and sholders   Cancer ([place])    basal cell/ left cheek/    PROSTATE   Coronary artery disease    ischemic heart disease-  stents x 2- 1999--LOV with clearance and note [name], EKG in [facility]   GERD (gastroesophageal reflux disease)    H/O hiatal hernia    Hypercholesterolemia    PONV (postoperative nausea and vomiting)    Renal stones     Past Surgical History:  Procedure Laterality Date   [place]   left ankle and lower leg   HERNIA REPAIR     umbilical   LITHOTRIPSY     ROBOT ASSISTED LAPAROSCOPIC RADICAL PROSTATECTOMY  03/29/2011   Procedure: ROBOTIC ASSISTED LAPAROSCOPIC RADICAL PROSTATECTOMY LEVEL 2;  Surgeon: [name], MD;  Location: [facility];  Service: Urology;  Laterality: N/A;  BILATERAL PELVIC  LYMPHADENECTOMY       testicular     tortion repair in teens     Current Outpatient Medications  Medication Sig Dispense Refill   aspirin EC 81 MG tablet Take 81 mg by mouth 2 (two) times daily.     celecoxib (CELEBREX) 200 MG capsule celecoxib 200 mg capsule  TAKE 1 CAPSULE BY MOUTH EVERY DAY     chlorthalidone (HYGROTON) 25 MG tablet TAKE 1 TABLET(25 MG) BY MOUTH DAILY. KEEP UPCOMING APPOINTMENT FOR FUTURE REFILLS 90 tablet 0   famotidine (PEPCID) 20 MG tablet Take 1 tablet by mouth as needed. With ibuprofen     metoprolol succinate (TOPROL-XL) 100 MG 24 hr tablet TAKE 1 TABLET(100 MG) BY MOUTH DAILY 90 tablet 3   Multiple Vitamin (MULTIVITAMIN WITH MINERALS) TABS tablet Take 1 tablet by mouth daily.     nitroGLYCERIN (NITROSTAT) 0.4 MG SL tablet Place 1 tablet (0.4 mg total) under the tongue every 5 (five) minutes as needed for chest pain. 25 tablet prn   rosuvastatin (CRESTOR) 40 MG tablet TAKE 1 TABLET(40 MG) BY MOUTH DAILY 90 tablet 3   valsartan (DIOVAN) 160 MG tablet TAKE 1 TABLET(160 MG) BY MOUTH DAILY 90 tablet 3   No current facility-administered medications for this visit.  Allergies:   Patient has no known allergies.    Social History:  The patient  reports that he has never smoked. He has never used smokeless tobacco. He reports that he does not drink alcohol and does not use drugs.   Family History:  The patient's family history includes Heart failure in his mother; Stroke in his father.    ROS:  Please see the history of present illness.   Otherwise, review of systems are positive for none.   All other systems are reviewed and negative.    PHYSICAL EXAM: VS:  BP 130/70    Pulse (!) 51    Ht 6\' 1"  (1.854 m)    Wt 256 lb 9.6 oz (116.4 kg)    SpO2 100%    BMI 33.85 kg/m  , BMI Body mass index is 33.85 kg/m. GENERAL:  Well appearing obese WM in NAD HEENT:  PERRL, EOMI, sclera are clear. Oropharynx is clear. NECK:  No jugular venous distention, carotid upstroke  brisk and symmetric, no bruits, no thyromegaly or adenopathy LUNGS:  Clear to auscultation bilaterally CHEST:  Unremarkable HEART:  RRR,  PMI not displaced or sustained,S1 and S2 within normal limits, no S3, no S4: no clicks, no rubs, soft 1/6 SEM ABD:  Soft, nontender. BS +, no masses or bruits. No hepatomegaly, no splenomegaly EXT:  2 + pulses throughout, no edema, no cyanosis no clubbing SKIN:  Warm and dry.  No rashes NEURO:  Alert and oriented x 3. Cranial nerves II through XII intact. PSYCH:  Cognitively intact     EKG:  EKG is  ordered today. It shows sinus brady rate 51. LAD, poor R wave progression. Nonspecific TWA. I have personally reviewed and interpreted this study.   Lab Results  Component Value Date   WBC 5.2 03/28/2020   HGB 13.9 03/28/2020   HCT 41.0 03/28/2020   PLT 140 (L) 03/28/2020   GLUCOSE 138 (H) 03/28/2020   CHOL 139 03/28/2020   TRIG 149 03/28/2020   HDL 38 (L) 03/28/2020   LDLCALC 75 03/28/2020   ALT 22 03/28/2020   AST 36 03/28/2020   NA 136 03/28/2020   K 3.9 03/28/2020   CL 102 03/28/2020   CREATININE 1.30 (H) 03/28/2020   BUN 20 03/28/2020   CO2 24 03/28/2020   TSH 6.210 (H) 03/31/2018   HGBA1C 7.4 (H) 03/28/2020     Wt Readings from Last 3 Encounters:  04/17/21 256 lb 9.6 oz (116.4 kg)  03/20/20 264 lb (119.7 kg)  04/11/18 240 lb (108.9 kg)     Echo 04/20/16: Study Conclusions   - Left ventricle: The cavity size was normal. There was severe   concentric hypertrophy. Systolic function was normal. The   estimated ejection fraction was in the range of 55% to 60%. Wall   motion was normal; there were no regional wall motion   abnormalities. Doppler parameters are consistent with a   reversible restrictive pattern, indicative of decreased left   ventricular diastolic compliance and/or increased left atrial   pressure (grade 3 diastolic dysfunction). Doppler parameters are   consistent with high ventricular filling pressure. - Aortic  valve: Valve mobility was restricted. There was mild   stenosis. There was no regurgitation. Peak velocity (S): 265   cm/s. Mean gradient (S): 14 mm Hg. Peak gradient (S): 28 mm Hg. - Mitral valve: Transvalvular velocity was within the normal range.   There was no evidence for stenosis. There was trivial   regurgitation. - Right  ventricle: The cavity size was normal. Wall thickness was   normal. Systolic function was normal. - Atrial septum: No defect or patent foramen ovale was identified   by color flow Doppler. - Tricuspid valve: There was no regurgitation.   Myoview 04/14/16: Study Highlights     The left ventricular ejection fraction is moderately decreased (30-44%). Nuclear stress EF: 44%. There was no ST segment deviation noted during stress. Blood pressure demonstrated a hypertensive response to exercise. This is an intermediate risk study.   1. EF 44%, diffuse hypokinesis.  2. Fixed, small, mild apical lateral perfusion defect.  Possible attenuation, but cannot rule out small area of infarction.  No ischemia.  3. Intermediate risk study due to low EF.      ASSESSMENT AND PLAN:  1.  HCM. Most recent Echo showed severe concentric LVH. No outflow gradient. He is asymptomatic.  Will continue Toprol.   2.  CAD status post stents to right coronary artery and circumflex in 1999. Myoview in February 2018 showed no ischemia.  No active angina. Continue ASA  3.  Hypercholesterolemia. On statin. Will arrange for fasting labs.   4.  Obesity, recommend weight loss and increased aerobic activity.  5.  History of prostate cancer treated with robotic surgery with Dr. [name]  6.  HTN  BP controlled on valsartan, chlorthalidone, and Toprol  7. Glucose intolerance/DM. Last A1c 7.4%. will repeat. May need to add SGLT 2 inhibitor or simaglutide.    Current medicines are reviewed at length with the patient today.  The patient does not have concerns regarding medicines.  The following  changes have been made: see above.  Labs/ tests ordered today include:   CBC, CMET, Lipids, A1c   Disposition: follow up in one year.  Signed, Brahim Dolman Martinique MD, [facility]   04/17/2021 2:54 PM    [place]

## 2021-04-17 ENCOUNTER — Other Ambulatory Visit: Payer: Self-pay

## 2021-04-17 ENCOUNTER — Ambulatory Visit: Payer: PPO | Admitting: Cardiology

## 2021-04-17 ENCOUNTER — Encounter: Payer: Self-pay | Admitting: Cardiology

## 2021-04-17 VITALS — BP 130/70 | HR 51 | Ht 73.0 in | Wt 256.6 lb

## 2021-04-17 DIAGNOSIS — I10 Essential (primary) hypertension: Secondary | ICD-10-CM | POA: Diagnosis not present

## 2021-04-17 DIAGNOSIS — E7800 Pure hypercholesterolemia, unspecified: Secondary | ICD-10-CM | POA: Diagnosis not present

## 2021-04-17 DIAGNOSIS — E119 Type 2 diabetes mellitus without complications: Secondary | ICD-10-CM | POA: Diagnosis not present

## 2021-04-17 DIAGNOSIS — Z794 Long term (current) use of insulin: Secondary | ICD-10-CM | POA: Diagnosis not present

## 2021-04-17 DIAGNOSIS — I422 Other hypertrophic cardiomyopathy: Secondary | ICD-10-CM | POA: Diagnosis not present

## 2021-04-17 DIAGNOSIS — I2510 Atherosclerotic heart disease of native coronary artery without angina pectoris: Secondary | ICD-10-CM

## 2021-04-17 MED ORDER — CHLORTHALIDONE 25 MG PO TABS: ORAL_TABLET | ORAL | 0 refills | Status: DC

## 2021-04-17 NOTE — Addendum Note (Signed)
Addended by: Kathyrn Lass on: 04/17/2021 02:59 PM   Modules accepted: Orders

## 2021-04-18 LAB — HEMOGLOBIN A1C
Est. average glucose Bld gHb Est-mCnc: 177 mg/dL
Hgb A1c MFr Bld: 7.8 % — ABNORMAL HIGH (ref 4.8–5.6)

## 2021-04-18 LAB — CBC WITH DIFFERENTIAL/PLATELET
Basophils Absolute: 0.1 10*3/uL (ref 0.0–0.2)
Basos: 1 %
EOS (ABSOLUTE): 0.2 10*3/uL (ref 0.0–0.4)
Eos: 3 %
Hematocrit: 41.6 % (ref 37.5–51.0)
Hemoglobin: 14.5 g/dL (ref 13.0–17.7)
Immature Grans (Abs): 0.1 10*3/uL (ref 0.0–0.1)
Immature Granulocytes: 1 %
Lymphocytes Absolute: 1.1 10*3/uL (ref 0.7–3.1)
Lymphs: 15 %
MCH: 31.3 pg (ref 26.6–33.0)
MCHC: 34.9 g/dL (ref 31.5–35.7)
MCV: 90 fL (ref 79–97)
Monocytes Absolute: 0.7 10*3/uL (ref 0.1–0.9)
Monocytes: 9 %
Neutrophils Absolute: 5.2 10*3/uL (ref 1.4–7.0)
Neutrophils: 71 %
Platelets: 159 10*3/uL (ref 150–450)
RBC: 4.64 x10E6/uL (ref 4.14–5.80)
RDW: 14.1 % (ref 11.6–15.4)
WBC: 7.3 10*3/uL (ref 3.4–10.8)

## 2021-04-18 LAB — LIPID PANEL
Chol/HDL Ratio: 3.5 ratio (ref 0.0–5.0)
Cholesterol, Total: 138 mg/dL (ref 100–199)
HDL: 40 mg/dL (ref >39)
LDL Chol Calc (NIH): 72 mg/dL (ref 0–99)
Triglycerides: 151 mg/dL — ABNORMAL HIGH (ref 0–149)
VLDL Cholesterol Cal: 26 mg/dL (ref 5–40)

## 2021-04-18 LAB — BASIC METABOLIC PANEL
BUN/Creatinine Ratio: 23 (ref 10–24)
BUN: 32 mg/dL — ABNORMAL HIGH (ref 8–27)
CO2: 27 mmol/L (ref 20–29)
Calcium: 9.6 mg/dL (ref 8.6–10.2)
Chloride: 101 mmol/L (ref 96–106)
Creatinine, Ser: 1.39 mg/dL — ABNORMAL HIGH (ref 0.76–1.27)
Glucose: 127 mg/dL — ABNORMAL HIGH (ref 70–99)
Potassium: 4.5 mmol/L (ref 3.5–5.2)
Sodium: 139 mmol/L (ref 134–144)
eGFR: 54 mL/min/{1.73_m2} — ABNORMAL LOW (ref >59)

## 2021-04-18 LAB — HEPATIC FUNCTION PANEL
ALT: 31 IU/L (ref 0–44)
AST: 56 IU/L — ABNORMAL HIGH (ref 0–40)
Albumin: 4.3 g/dL (ref 3.7–4.7)
Alkaline Phosphatase: 59 IU/L (ref 44–121)
Bilirubin Total: 1.3 mg/dL — ABNORMAL HIGH (ref 0.0–1.2)
Bilirubin, Direct: 0.37 mg/dL (ref 0.00–0.40)
Total Protein: 7.1 g/dL (ref 6.0–8.5)

## 2021-04-24 DIAGNOSIS — M545 Low back pain, unspecified: Secondary | ICD-10-CM | POA: Diagnosis not present

## 2021-04-24 DIAGNOSIS — M25552 Pain in left hip: Secondary | ICD-10-CM | POA: Diagnosis not present

## 2021-05-02 ENCOUNTER — Other Ambulatory Visit: Payer: Self-pay | Admitting: Cardiology

## 2021-06-08 ENCOUNTER — Other Ambulatory Visit: Payer: Self-pay | Admitting: Cardiology

## 2021-06-08 DIAGNOSIS — I10 Essential (primary) hypertension: Secondary | ICD-10-CM

## 2021-06-09 DIAGNOSIS — L821 Other seborrheic keratosis: Secondary | ICD-10-CM | POA: Diagnosis not present

## 2021-06-09 DIAGNOSIS — L820 Inflamed seborrheic keratosis: Secondary | ICD-10-CM | POA: Diagnosis not present

## 2021-06-09 DIAGNOSIS — D485 Neoplasm of uncertain behavior of skin: Secondary | ICD-10-CM | POA: Diagnosis not present

## 2021-06-09 DIAGNOSIS — L570 Actinic keratosis: Secondary | ICD-10-CM | POA: Diagnosis not present

## 2021-06-09 DIAGNOSIS — D1801 Hemangioma of skin and subcutaneous tissue: Secondary | ICD-10-CM | POA: Diagnosis not present

## 2021-06-09 DIAGNOSIS — L738 Other specified follicular disorders: Secondary | ICD-10-CM | POA: Diagnosis not present

## 2021-06-20 ENCOUNTER — Other Ambulatory Visit: Payer: Self-pay | Admitting: Cardiology

## 2021-08-28 DIAGNOSIS — M25552 Pain in left hip: Secondary | ICD-10-CM | POA: Diagnosis not present

## 2021-08-28 DIAGNOSIS — M545 Low back pain, unspecified: Secondary | ICD-10-CM | POA: Diagnosis not present

## 2021-10-13 DIAGNOSIS — L723 Sebaceous cyst: Secondary | ICD-10-CM | POA: Diagnosis not present

## 2021-11-13 DIAGNOSIS — R490 Dysphonia: Secondary | ICD-10-CM | POA: Diagnosis not present

## 2021-11-13 DIAGNOSIS — J301 Allergic rhinitis due to pollen: Secondary | ICD-10-CM | POA: Diagnosis not present

## 2021-11-13 DIAGNOSIS — J3089 Other allergic rhinitis: Secondary | ICD-10-CM | POA: Diagnosis not present

## 2021-11-13 DIAGNOSIS — J3081 Allergic rhinitis due to animal (cat) (dog) hair and dander: Secondary | ICD-10-CM | POA: Diagnosis not present

## 2021-11-20 DIAGNOSIS — Z8546 Personal history of malignant neoplasm of prostate: Secondary | ICD-10-CM | POA: Diagnosis not present

## 2021-11-27 DIAGNOSIS — Z8546 Personal history of malignant neoplasm of prostate: Secondary | ICD-10-CM | POA: Diagnosis not present

## 2021-11-27 DIAGNOSIS — N5201 Erectile dysfunction due to arterial insufficiency: Secondary | ICD-10-CM | POA: Diagnosis not present

## 2021-11-27 DIAGNOSIS — Z87442 Personal history of urinary calculi: Secondary | ICD-10-CM | POA: Diagnosis not present

## 2021-12-08 DIAGNOSIS — R490 Dysphonia: Secondary | ICD-10-CM | POA: Diagnosis not present

## 2021-12-10 ENCOUNTER — Telehealth: Payer: Self-pay | Admitting: Cardiology

## 2021-12-10 NOTE — Telephone Encounter (Signed)
*  STAT* If patient is at the pharmacy, call can be transferred to refill team.   1. Which medications need to be refilled? (please list name of each medication and dose if known)   rosuvastatin (CRESTOR) 40 MG tablet    2. Which pharmacy/location (including street and city if local pharmacy) is medication to be sent to?[id number] - [place], [place] - [address] AT [address]  3. Do they need a 30 day or 90 day supply?  90 day

## 2021-12-11 MED ORDER — ROSUVASTATIN CALCIUM 40 MG PO TABS: ORAL_TABLET | ORAL | 2 refills | Status: DC

## 2022-02-10 DIAGNOSIS — R1084 Generalized abdominal pain: Secondary | ICD-10-CM | POA: Diagnosis not present

## 2022-03-24 DIAGNOSIS — H53021 Refractive amblyopia, right eye: Secondary | ICD-10-CM | POA: Diagnosis not present

## 2022-03-24 DIAGNOSIS — H02834 Dermatochalasis of left upper eyelid: Secondary | ICD-10-CM | POA: Diagnosis not present

## 2022-03-24 DIAGNOSIS — H1045 Other chronic allergic conjunctivitis: Secondary | ICD-10-CM | POA: Diagnosis not present

## 2022-03-24 DIAGNOSIS — H26493 Other secondary cataract, bilateral: Secondary | ICD-10-CM | POA: Diagnosis not present

## 2022-03-24 DIAGNOSIS — H04123 Dry eye syndrome of bilateral lacrimal glands: Secondary | ICD-10-CM | POA: Diagnosis not present

## 2022-03-24 DIAGNOSIS — H02831 Dermatochalasis of right upper eyelid: Secondary | ICD-10-CM | POA: Diagnosis not present

## 2022-03-24 DIAGNOSIS — H11123 Conjunctival concretions, bilateral: Secondary | ICD-10-CM | POA: Diagnosis not present

## 2022-03-24 DIAGNOSIS — H43813 Vitreous degeneration, bilateral: Secondary | ICD-10-CM | POA: Diagnosis not present

## 2022-03-24 DIAGNOSIS — Z961 Presence of intraocular lens: Secondary | ICD-10-CM | POA: Diagnosis not present

## 2022-03-24 DIAGNOSIS — H43392 Other vitreous opacities, left eye: Secondary | ICD-10-CM | POA: Diagnosis not present

## 2022-04-13 NOTE — Progress Notes (Signed)
Cardiology Office Note   Date:  04/19/2022   ID:  Lucas, Buckley [birth year], MRN [id number]  PCP:  [name], MD (Inactive)  Cardiologist: Xitlaly Ault Martinique MD  Chief Complaint  Patient presents with   Coronary Artery Disease   Cardiomyopathy      History of Present Illness: Lucas Buckley is a 72 y.o. male who presents for follow up of HCM and CAD.    The patient has a history of known ischemic heart disease.  In 1999 he had stents to his right coronary artery and to his circumflex. The patient has a history of HOCM.  He also has a history of hypercholesterolemia and borderline glucose intolerance and exogenous obesity.  He had a stress Myoview study in February 2018 showing a small fixed apical lateral defect. No ischemia. EF 44%. Echo was done showing severe concentric LVH. No outflow gradient. Mild AS. EF was normal.   On follow up today he reports he is doing very well from a cardiac standpoint. He denies any chest pain or SOB. No edema or palpitations. Weight is stable. 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 He is walking 10K steps a day.   Past Medical History:  Diagnosis Date   Arthritis    knee and sholders   Cancer ([place])    basal cell/ left cheek/    PROSTATE   Coronary artery disease    ischemic heart disease-  stents x 2- 1999--LOV with clearance and note [name], EKG in [facility]   GERD (gastroesophageal reflux disease)    H/O hiatal hernia    Hypercholesterolemia    PONV (postoperative nausea and vomiting)    Renal stones     Past Surgical History:  Procedure Laterality Date   [place]   left ankle and lower leg   HERNIA REPAIR     umbilical   LITHOTRIPSY     ROBOT ASSISTED LAPAROSCOPIC RADICAL PROSTATECTOMY  03/29/2011   Procedure: ROBOTIC ASSISTED LAPAROSCOPIC RADICAL PROSTATECTOMY LEVEL 2;  Surgeon: [name], MD;  Location: [facility];  Service: Urology;  Laterality: N/A;   BILATERAL PELVIC LYMPHADENECTOMY       testicular     tortion repair in teens     Current Outpatient Medications  Medication Sig Dispense Refill   aspirin EC 81 MG tablet Take 81 mg by mouth 2 (two) times daily.     chlorthalidone (HYGROTON) 25 MG tablet TAKE 1 TABLET BY MOUTH EVERY DAY. 90 tablet 3   famotidine (PEPCID) 20 MG tablet Take 1 tablet by mouth as needed. With ibuprofen     metoprolol succinate (TOPROL-XL) 100 MG 24 hr tablet TAKE 1 TABLET(100 MG) BY MOUTH DAILY 90 tablet 3   Multiple Vitamin (MULTIVITAMIN WITH MINERALS) TABS tablet Take 1 tablet by mouth daily.     nitroGLYCERIN (NITROSTAT) 0.4 MG SL tablet Place 1 tablet (0.4 mg total) under the tongue every 5 (five) minutes as needed for chest pain. 25 tablet prn   rosuvastatin (CRESTOR) 40 MG tablet TAKE 1 TABLET(40 MG) BY MOUTH DAILY 90 tablet 2   valsartan (DIOVAN) 160 MG tablet TAKE 1 TABLET(160 MG) BY MOUTH DAILY 90 tablet 3   celecoxib (CELEBREX) 200 MG capsule celecoxib 200 mg capsule  TAKE 1 CAPSULE BY MOUTH EVERY DAY (Patient not taking: Reported on 04/19/2022)     No current facility-administered medications for this visit.  Allergies:   Patient has no known allergies.    Social History:  The patient  reports that he has never smoked. He has never used smokeless tobacco. He reports that he does not drink alcohol and does not use drugs.   Family History:  The patient's family history includes Heart failure in his mother; Stroke in his father.    ROS:  Please see the history of present illness.   Otherwise, review of systems are positive for none.   All other systems are reviewed and negative.    PHYSICAL EXAM: VS:  BP (!) 122/58 (BP Location: Left Arm, Patient Position: Sitting, Cuff Size: Large)   Pulse (!) 57   Ht '6\' 1"'$  (1.854 m)   Wt 255 lb (115.7 kg)   SpO2 97%   BMI 33.64 kg/m  , BMI Body mass index is 33.64 kg/m. GENERAL:  Well appearing obese WM in NAD HEENT:  PERRL, EOMI, sclera are  clear. Oropharynx is clear. NECK:  No jugular venous distention, carotid upstroke brisk and symmetric, no bruits, no thyromegaly or adenopathy LUNGS:  Clear to auscultation bilaterally CHEST:  Unremarkable HEART:  RRR,  PMI not displaced or sustained,S1 and S2 within normal limits, no S3, no S4: no clicks, no rubs, soft 1/6 SEM ABD:  Soft, nontender. BS +, no masses or bruits. No hepatomegaly, no splenomegaly EXT:  2 + pulses throughout, no edema, no cyanosis no clubbing SKIN:  Warm and dry.  No rashes NEURO:  Alert and oriented x 3. Cranial nerves II through XII intact. PSYCH:  Cognitively intact     EKG:  EKG is  ordered today. It shows sinus brady rate 57. LAD, poor R wave progression. Nonspecific TWA. I have personally reviewed and interpreted this study.   Lab Results  Component Value Date   WBC 7.3 04/17/2021   HGB 14.5 04/17/2021   HCT 41.6 04/17/2021   PLT 159 04/17/2021   GLUCOSE 127 (H) 04/17/2021   CHOL 138 04/17/2021   TRIG 151 (H) 04/17/2021   HDL 40 04/17/2021   LDLCALC 72 04/17/2021   ALT 31 04/17/2021   AST 56 (H) 04/17/2021   NA 139 04/17/2021   K 4.5 04/17/2021   CL 101 04/17/2021   CREATININE 1.39 (H) 04/17/2021   BUN 32 (H) 04/17/2021   CO2 27 04/17/2021   TSH 6.210 (H) 03/31/2018   HGBA1C 7.8 (H) 04/17/2021   Dated 04/17/21: A1c 7.8%. creatinine 1.39. BUN 32. Glucose 127, cholesterol 138, trig 151, HDL 40, LDL 72.  Wt Readings from Last 3 Encounters:  04/19/22 255 lb (115.7 kg)  04/17/21 256 lb 9.6 oz (116.4 kg)  03/20/20 264 lb (119.7 kg)     Echo 04/20/16: Study Conclusions   - Left ventricle: The cavity size was normal. There was severe   concentric hypertrophy. Systolic function was normal. The   estimated ejection fraction was in the range of 55% to 60%. Wall   motion was normal; there were no regional wall motion   abnormalities. Doppler parameters are consistent with a   reversible restrictive pattern, indicative of decreased left    ventricular diastolic compliance and/or increased left atrial   pressure (grade 3 diastolic dysfunction). Doppler parameters are   consistent with high ventricular filling pressure. - Aortic valve: Valve mobility was restricted. There was mild   stenosis. There was no regurgitation. Peak velocity (S): 265   cm/s. Mean gradient (S): 14 mm Hg. Peak gradient (S): 28 mm Hg. - Mitral valve: Transvalvular  velocity was within the normal range.   There was no evidence for stenosis. There was trivial   regurgitation. - Right ventricle: The cavity size was normal. Wall thickness was   normal. Systolic function was normal. - Atrial septum: No defect or patent foramen ovale was identified   by color flow Doppler. - Tricuspid valve: There was no regurgitation.   Myoview 04/14/16: Study Highlights     The left ventricular ejection fraction is moderately decreased (30-44%). Nuclear stress EF: 44%. There was no ST segment deviation noted during stress. Blood pressure demonstrated a hypertensive response to exercise. This is an intermediate risk study.   1. EF 44%, diffuse hypokinesis.  2. Fixed, small, mild apical lateral perfusion defect.  Possible attenuation, but cannot rule out small area of infarction.  No ischemia.  3. Intermediate risk study due to low EF.      ASSESSMENT AND PLAN:  1.  HCM. Most recent Echo in 2018 showed severe concentric LVH. No outflow gradient. He is asymptomatic.  Will continue Toprol.   2.  CAD status post stents to right coronary artery and circumflex in 1999. Myoview in February 2018 showed no ischemia.  No active angina. Continue ASA  3.  Hypercholesterolemia. On statin. Will check lab work today  4.  Obesity, recommend weight loss   5.  History of prostate cancer treated with robotic surgery with Dr. [name]  6.  HTN  BP controlled on valsartan, chlorthalidone, and Toprol  7. Glucose intolerance/DM. Last A1c 7.4%. will repeat.   8. CKD stage 3a. Will  update labs.     Current medicines are reviewed at length with the patient today.  The patient does not have concerns regarding medicines.  The following changes have been made: see above.  Labs/ tests ordered today include:   CBC, CMET, Lipids, A1c   Disposition: follow up in one year.  Signed, Karla Pavone Martinique MD, [facility]   04/19/2022 11:00 AM    [place]

## 2022-04-19 ENCOUNTER — Encounter: Payer: Self-pay | Admitting: Cardiology

## 2022-04-19 ENCOUNTER — Ambulatory Visit: Payer: PPO | Attending: Cardiology | Admitting: Cardiology

## 2022-04-19 VITALS — BP 122/58 | HR 57 | Ht 73.0 in | Wt 255.0 lb

## 2022-04-19 DIAGNOSIS — I10 Essential (primary) hypertension: Secondary | ICD-10-CM

## 2022-04-19 DIAGNOSIS — I422 Other hypertrophic cardiomyopathy: Secondary | ICD-10-CM

## 2022-04-19 DIAGNOSIS — E7800 Pure hypercholesterolemia, unspecified: Secondary | ICD-10-CM

## 2022-04-19 DIAGNOSIS — I2510 Atherosclerotic heart disease of native coronary artery without angina pectoris: Secondary | ICD-10-CM | POA: Diagnosis not present

## 2022-04-19 NOTE — Patient Instructions (Signed)
Medication Instructions:  Your physician recommends that you continue on your current medications as directed. Please refer to the Current Medication list given to you today.  *If you need a refill on your cardiac medications before your next appointment, please call your pharmacy*   Lab Work: Your physician recommends that you have labs drawn today: CMET, CBC, Lipids, and A1C  If you have labs (blood work) drawn today and your tests are completely normal, you will receive your results only by: [place] (if you have MyChart) OR A paper copy in the mail If you have any lab test that is abnormal or we need to change your treatment, we will call you to review the results.   Follow-Up: At [facility], you and your health needs are our priority.  As part of our continuing mission to provide you with exceptional heart care, we have created designated Provider Care Teams.  These Care Teams include your primary Cardiologist (physician) and Advanced Practice Providers (APPs -  Physician Assistants and Nurse Practitioners) who all work together to provide you with the care you need, when you need it.  We recommend signing up for the patient portal called "MyChart".  Sign up information is provided on this After Visit Summary.  MyChart is used to connect with patients for Virtual Visits (Telemedicine).  Patients are able to view lab/test results, encounter notes, upcoming appointments, etc.  Non-urgent messages can be sent to your provider as well.   To learn more about what you can do with MyChart, go to [email or web address].    Your next appointment:   12 month(s)  Provider:   Peter Martinique, MD

## 2022-04-20 LAB — CBC
Hematocrit: 43.4 % (ref 37.5–51.0)
Hemoglobin: 14.4 g/dL (ref 13.0–17.7)
MCH: 31.2 pg (ref 26.6–33.0)
MCHC: 33.2 g/dL (ref 31.5–35.7)
MCV: 94 fL (ref 79–97)
Platelets: 157 10*3/uL (ref 150–450)
RBC: 4.62 x10E6/uL (ref 4.14–5.80)
RDW: 14.5 % (ref 11.6–15.4)
WBC: 6 10*3/uL (ref 3.4–10.8)

## 2022-04-20 LAB — COMPREHENSIVE METABOLIC PANEL
ALT: 23 IU/L (ref 0–44)
AST: 41 IU/L — ABNORMAL HIGH (ref 0–40)
Albumin/Globulin Ratio: 1.5 (ref 1.2–2.2)
Albumin: 4.2 g/dL (ref 3.8–4.8)
Alkaline Phosphatase: 65 IU/L (ref 44–121)
BUN/Creatinine Ratio: 17 (ref 10–24)
BUN: 20 mg/dL (ref 8–27)
Bilirubin Total: 0.8 mg/dL (ref 0.0–1.2)
CO2: 23 mmol/L (ref 20–29)
Calcium: 9.7 mg/dL (ref 8.6–10.2)
Chloride: 104 mmol/L (ref 96–106)
Creatinine, Ser: 1.18 mg/dL (ref 0.76–1.27)
Globulin, Total: 2.8 g/dL (ref 1.5–4.5)
Glucose: 126 mg/dL — ABNORMAL HIGH (ref 70–99)
Potassium: 4.1 mmol/L (ref 3.5–5.2)
Sodium: 142 mmol/L (ref 134–144)
Total Protein: 7 g/dL (ref 6.0–8.5)
eGFR: 66 mL/min/{1.73_m2} (ref >59)

## 2022-04-20 LAB — LIPID PANEL
Chol/HDL Ratio: 3.2 ratio (ref 0.0–5.0)
Cholesterol, Total: 175 mg/dL (ref 100–199)
HDL: 55 mg/dL (ref >39)
LDL Chol Calc (NIH): 105 mg/dL — ABNORMAL HIGH (ref 0–99)
Triglycerides: 82 mg/dL (ref 0–149)
VLDL Cholesterol Cal: 15 mg/dL (ref 5–40)

## 2022-04-20 LAB — HEMOGLOBIN A1C
Est. average glucose Bld gHb Est-mCnc: 163 mg/dL
Hgb A1c MFr Bld: 7.3 % — ABNORMAL HIGH (ref 4.8–5.6)

## 2022-04-22 ENCOUNTER — Other Ambulatory Visit: Payer: Self-pay

## 2022-04-22 DIAGNOSIS — I2510 Atherosclerotic heart disease of native coronary artery without angina pectoris: Secondary | ICD-10-CM

## 2022-04-22 DIAGNOSIS — E7800 Pure hypercholesterolemia, unspecified: Secondary | ICD-10-CM

## 2022-04-22 MED ORDER — EZETIMIBE 10 MG PO TABS: 10.0000 mg | ORAL_TABLET | Freq: Every day | ORAL | 3 refills | Status: DC

## 2022-04-30 DIAGNOSIS — M545 Low back pain, unspecified: Secondary | ICD-10-CM | POA: Diagnosis not present

## 2022-05-04 ENCOUNTER — Other Ambulatory Visit: Payer: Self-pay | Admitting: Cardiology

## 2022-06-04 ENCOUNTER — Other Ambulatory Visit: Payer: Self-pay | Admitting: Cardiology

## 2022-06-04 DIAGNOSIS — I10 Essential (primary) hypertension: Secondary | ICD-10-CM

## 2022-06-24 DIAGNOSIS — L820 Inflamed seborrheic keratosis: Secondary | ICD-10-CM | POA: Diagnosis not present

## 2022-06-24 DIAGNOSIS — L821 Other seborrheic keratosis: Secondary | ICD-10-CM | POA: Diagnosis not present

## 2022-06-24 DIAGNOSIS — L570 Actinic keratosis: Secondary | ICD-10-CM | POA: Diagnosis not present

## 2022-06-24 DIAGNOSIS — L812 Freckles: Secondary | ICD-10-CM | POA: Diagnosis not present

## 2022-06-26 ENCOUNTER — Other Ambulatory Visit: Payer: Self-pay | Admitting: Cardiology

## 2022-07-23 ENCOUNTER — Other Ambulatory Visit: Payer: Self-pay

## 2022-07-23 DIAGNOSIS — E7800 Pure hypercholesterolemia, unspecified: Secondary | ICD-10-CM | POA: Diagnosis not present

## 2022-07-23 DIAGNOSIS — I2510 Atherosclerotic heart disease of native coronary artery without angina pectoris: Secondary | ICD-10-CM

## 2022-07-24 LAB — LIPID PANEL
Chol/HDL Ratio: 1.9 ratio (ref 0.0–5.0)
Cholesterol, Total: 127 mg/dL (ref 100–199)
HDL: 68 mg/dL (ref >39)
LDL Chol Calc (NIH): 46 mg/dL (ref 0–99)
Triglycerides: 59 mg/dL (ref 0–149)
VLDL Cholesterol Cal: 13 mg/dL (ref 5–40)

## 2022-07-24 LAB — HEPATIC FUNCTION PANEL
ALT: 36 IU/L (ref 0–44)
AST: 47 IU/L — ABNORMAL HIGH (ref 0–40)
Albumin: 4.3 g/dL (ref 3.8–4.8)
Alkaline Phosphatase: 58 IU/L (ref 44–121)
Bilirubin Total: 0.9 mg/dL (ref 0.0–1.2)
Bilirubin, Direct: 0.29 mg/dL (ref 0.00–0.40)
Total Protein: 6.9 g/dL (ref 6.0–8.5)

## 2022-08-30 ENCOUNTER — Other Ambulatory Visit: Payer: Self-pay | Admitting: Cardiology

## 2022-09-08 DIAGNOSIS — M25521 Pain in right elbow: Secondary | ICD-10-CM | POA: Diagnosis not present

## 2022-09-13 DIAGNOSIS — M25521 Pain in right elbow: Secondary | ICD-10-CM | POA: Diagnosis not present

## 2022-11-05 DIAGNOSIS — M545 Low back pain, unspecified: Secondary | ICD-10-CM | POA: Diagnosis not present

## 2022-11-05 DIAGNOSIS — M7918 Myalgia, other site: Secondary | ICD-10-CM | POA: Diagnosis not present

## 2022-12-07 DIAGNOSIS — Z8546 Personal history of malignant neoplasm of prostate: Secondary | ICD-10-CM | POA: Diagnosis not present

## 2022-12-10 DIAGNOSIS — N5201 Erectile dysfunction due to arterial insufficiency: Secondary | ICD-10-CM | POA: Diagnosis not present

## 2022-12-10 DIAGNOSIS — Z8546 Personal history of malignant neoplasm of prostate: Secondary | ICD-10-CM | POA: Diagnosis not present

## 2022-12-30 DIAGNOSIS — K432 Incisional hernia without obstruction or gangrene: Secondary | ICD-10-CM | POA: Diagnosis not present

## 2023-01-11 ENCOUNTER — Telehealth: Payer: Self-pay | Admitting: *Deleted

## 2023-01-11 NOTE — Telephone Encounter (Signed)
Pt has been scheduled tele pre op appt 02/07/23. Pt states surgeon is planning surgery for early Jan 2025. Med rec and consent are done.

## 2023-01-11 NOTE — Telephone Encounter (Signed)
Pt has been scheduled tele pre op appt 02/07/23. Pt states surgeon is planning surgery for early Jan 2025. Med rec and consent are done.     Patient Consent for Virtual Visit        Lucas Buckley has provided verbal consent on 01/11/2023 for a virtual visit (video or telephone).   CONSENT FOR VIRTUAL VISIT FOR:  Lucas Buckley  By participating in this virtual visit I agree to the following:  I hereby voluntarily request, consent and authorize [facility] and its employed or contracted physicians, physician assistants, nurse practitioners or other licensed health care professionals (the Practitioner), to provide me with telemedicine health care services (the "Services") as deemed necessary by the treating Practitioner. I acknowledge and consent to receive the Services by the Practitioner via telemedicine. I understand that the telemedicine visit will involve communicating with the Practitioner through live audiovisual communication technology and the disclosure of certain medical information by electronic transmission. I acknowledge that I have been given the opportunity to request an in-person assessment or other available alternative prior to the telemedicine visit and am voluntarily participating in the telemedicine visit.  I understand that I have the right to withhold or withdraw my consent to the use of telemedicine in the course of my care at any time, without affecting my right to future care or treatment, and that the Practitioner or I may terminate the telemedicine visit at any time. I understand that I have the right to inspect all information obtained and/or recorded in the course of the telemedicine visit and may receive copies of available information for a reasonable fee.  I understand that some of the potential risks of receiving the Services via telemedicine include:  Delay or interruption in medical evaluation due to technological equipment failure or  disruption; Information transmitted may not be sufficient (e.g. poor resolution of images) to allow for appropriate medical decision making by the Practitioner; and/or  In rare instances, security protocols could fail, causing a breach of personal health information.  Furthermore, I acknowledge that it is my responsibility to provide information about my medical history, conditions and care that is complete and accurate to the best of my ability. I acknowledge that Practitioner's advice, recommendations, and/or decision may be based on factors not within their control, such as incomplete or inaccurate data provided by me or distortions of diagnostic images or specimens that may result from electronic transmissions. I understand that the practice of medicine is not an exact science and that Practitioner makes no warranties or guarantees regarding treatment outcomes. I acknowledge that a copy of this consent can be made available to me via my patient portal [facility] MyChart), or I can request a printed copy by calling the office of [facility].    I understand that my insurance will be billed for this visit.   I have read or had this consent read to me. I understand the contents of this consent, which adequately explains the benefits and risks of the Services being provided via telemedicine.  I have been provided ample opportunity to ask questions regarding this consent and the Services and have had my questions answered to my satisfaction. I give my informed consent for the services to be provided through the use of telemedicine in my medical care

## 2023-01-11 NOTE — Telephone Encounter (Signed)
   Pre-operative Risk Assessment    Patient Name: Lucas Buckley  DOB: 02/14/1951 MRN: [id number]  DATE OF LAST VISIT: 04/19/22 DR. [place] DATE OF NEXT VISIT: NONE    Request for Surgical Clearance    Procedure:   HERNIA SURGERY PER CLEARANCE FORM  Date of Surgery:  Clearance TBD                                 Surgeon:  DR. PAUL STECHSCHULTE Surgeon's Group or Practice Name:  [facility] Phone number:  [phone] Fax number:  [phone] [name], CMA   Type of Clearance Requested:   - Medical  - Pharmacy:  Hold Aspirin     Type of Anesthesia:  General    Additional requests/questions:    [name]   01/11/2023, 9:53 AM

## 2023-01-11 NOTE — Telephone Encounter (Signed)
Good Morning Dr. [place]  We have received a surgical clearance request for Lucas Buckley for hernia surgical repair.  He has a past medical history of CAD with stents placed to RCA and circumflex in 1999, HOCM.  There is no data on stent length in our chart and per protocol guidance will need to come from primary cardiologist. Can you please comment on holding ASA 81 mg prior to his upcoming hernia repair. Please forward you guidance and recommendations to P [facility] DIV PREOP   Thank you, Robin Searing, NP

## 2023-01-11 NOTE — Telephone Encounter (Signed)
Patient returned call, he can be reached at [phone]

## 2023-01-11 NOTE — Telephone Encounter (Signed)
Left message to call back to schedule a tele pre op appt.  ?

## 2023-01-11 NOTE — Telephone Encounter (Signed)
   Name: Lucas Buckley  DOB: 03-19-1950  MRN: [id number]  Primary Cardiologist: Peter Swaziland, MD   Preoperative team, please contact this patient and set up a phone call appointment for further preoperative risk assessment. Please obtain consent and complete medication review. Thank you for your help.  I confirm that guidance regarding antiplatelet and oral anticoagulation therapy has been completed and, if necessary, noted below.  Per Dr. Swaziland patient can hold aspirin 5 days prior to procedure and should restart postprocedure once hemostasis is achieved.  I also confirmed the patient resides in the state of [place]. As per [facility] Medical Board telemedicine laws, the patient must reside in the state in which the provider is licensed.   Napoleon Form, Leodis Rains, NP 01/11/2023, 11:26 AM [facility]

## 2023-02-07 ENCOUNTER — Ambulatory Visit: Payer: PPO | Attending: Cardiovascular Disease | Admitting: Nurse Practitioner

## 2023-02-07 DIAGNOSIS — Z01810 Encounter for preprocedural cardiovascular examination: Secondary | ICD-10-CM

## 2023-02-07 NOTE — Progress Notes (Signed)
Virtual Visit via Telephone Note   Because of Lucas Buckley's co-morbid illnesses, he is at least at moderate risk for complications without adequate follow up.  This format is felt to be most appropriate for this patient at this time.  The patient did not have access to video technology/had technical difficulties with video requiring transitioning to audio format only (telephone).  All issues noted in this document were discussed and addressed.  No physical exam could be performed with this format.  Please refer to the patient's chart for his consent to telehealth for [facility].  Evaluation Performed:  Preoperative cardiovascular risk assessment _____________   Date:  02/07/2023   Patient ID:  Lucas Buckley, DOB 06/23/1950, MRN [id number] Patient Location:  Home Provider location:   Office  Primary Care Provider:  Elias Else, MD (Inactive) Primary Cardiologist:  Peter Swaziland, MD  Chief Complaint / Patient Profile   72 y.o. y/o male with a h/o CAD s/p stenting-RCA and LCx in 1999, HOCM, mild aortic stenosis, hypertension, hyperlipidemia, type 2 diabetes, CKD stage IIIa, prostate cancer and obesity who is pending umbilical hernia surgery with Dr. Ivar Drape of [facility] and presents today for telephonic preoperative cardiovascular risk assessment.  History of Present Illness    Lucas Buckley is a 72 y.o. male who presents via audio/video conferencing for a telehealth visit today.  Pt was last seen in cardiology clinic on 04/19/2022 by Dr. Swaziland.  At that time ADONIRAM CICOTTE was doing well.  The patient is now pending procedure as outlined above. Since his last visit, he has done well from a cardiac standpoint.   He denies chest pain, palpitations, dyspnea, pnd, orthopnea, n, v, dizziness, syncope, edema, weight gain, or early satiety. All other systems reviewed and are otherwise negative except as noted above.   Past Medical History    Past Medical History:   Diagnosis Date   Arthritis    knee and sholders   Cancer (HCC)    basal cell/ left cheek/    PROSTATE   Coronary artery disease    ischemic heart disease-  stents x 2- 1999--LOV with clearance and note Dr Patty Sermons, EKG in [facility]   GERD (gastroesophageal reflux disease)    H/O hiatal hernia    Hypercholesterolemia    PONV (postoperative nausea and vomiting)    Renal stones    Past Surgical History:  Procedure Laterality Date   CARDIAC CATHETERIZATION     1999   CORONARY ANGIOPLASTY     FRACTURE SURGERY  1974   left ankle and lower leg   HERNIA REPAIR     umbilical   LITHOTRIPSY     ROBOT ASSISTED LAPAROSCOPIC RADICAL PROSTATECTOMY  03/29/2011   Procedure: ROBOTIC ASSISTED LAPAROSCOPIC RADICAL PROSTATECTOMY LEVEL 2;  Surgeon: Crecencio Mc, MD;  Location: [facility];  Service: Urology;  Laterality: N/A;  BILATERAL PELVIC LYMPHADENECTOMY       testicular     tortion repair in teens    Allergies  No Known Allergies  Home Medications    Prior to Admission medications   Medication Sig Start Date End Date Taking? Authorizing Provider  alum hydroxide-mag trisilicate (GAVISCON) 80-20 MG CHEW chewable tablet Chew 2 tablets by mouth at bedtime as needed for indigestion or heartburn.    [provider]  aspirin EC 81 MG tablet Take 162 mg by mouth daily.    [provider]  celecoxib (CELEBREX) 200 MG capsule celecoxib 200 mg capsule  TAKE  1 CAPSULE BY MOUTH EVERY DAY Patient not taking: Reported on 04/19/2022    [provider]  chlorthalidone (HYGROTON) 25 MG tablet TAKE 1 TABLET BY MOUTH EVERY DAY 06/28/22   Swaziland, Peter M, MD  ezetimibe (ZETIA) 10 MG tablet Take 1 tablet (10 mg total) by mouth daily. Patient not taking: Reported on 01/11/2023 04/22/22 08/20/22  Corrin Parker, PA-C  famotidine (PEPCID) 20 MG tablet Take 1 tablet by mouth as needed. With ibuprofen Patient not taking: Reported on 01/11/2023 03/31/16   [provider]  metoprolol  succinate (TOPROL-XL) 100 MG 24 hr tablet Take 1 tablet (100 mg total) by mouth daily. 05/04/22   Swaziland, Peter M, MD  Multiple Vitamin (MULTIVITAMIN WITH MINERALS) TABS tablet Take 1 tablet by mouth daily.    [provider]  nitroGLYCERIN (NITROSTAT) 0.4 MG SL tablet Place 1 tablet (0.4 mg total) under the tongue every 5 (five) minutes as needed for chest pain. Patient not taking: Reported on 01/11/2023 03/27/14   Cassell Clement, MD  rosuvastatin (CRESTOR) 40 MG tablet TAKE 1 TABLET(40 MG) BY MOUTH DAILY 08/30/22   Swaziland, Peter M, MD  valsartan (DIOVAN) 160 MG tablet TAKE 1 TABLET(160 MG) BY MOUTH DAILY 06/04/22   Swaziland, Peter M, MD    Physical Exam    Vital Signs:  KIYA ELEK does not have vital signs available for review today.  Given telephonic nature of communication, physical exam is limited. AAOx3. NAD. Normal affect.  Speech and respirations are unlabored.  Accessory Clinical Findings    None  Assessment & Plan    1.  Preoperative Cardiovascular Risk Assessment:  According to the Revised Cardiac Risk Index (RCRI), his Perioperative Risk of Major Cardiac Event is (%): 0.9. His Functional Capacity in METs is: 7.34 according to the [facility] Activity Status Index (DASI). Therefore, based on ACC/AHA guidelines, patient would be at acceptable risk for the planned procedure without further cardiovascular testing.  The patient was advised that if he develops new symptoms prior to surgery to contact our office to arrange for a follow-up visit, and he verbalized understanding.  Per Dr. Swaziland patient can hold Aspirin for 5 days prior to procedure. Please resume Aspirin as soon as possible postprocedure, at the discretion of the surgeon.    A copy of this note will be routed to requesting surgeon.  Time:   Today, I have spent 6 minutes with the patient with telehealth technology discussing medical history, symptoms, and management plan.     Joylene Grapes, NP  02/07/2023,  10:27 AM

## 2023-03-23 ENCOUNTER — Ambulatory Visit: Payer: Self-pay | Admitting: Surgery

## 2023-04-04 NOTE — Patient Instructions (Addendum)
 SURGICAL WAITING ROOM VISITATION  Patients having surgery or a procedure may have no more than 2 support people in the waiting area - these visitors may rotate.    Children under the age of 31 must have an adult with them who is not the patient.  Due to an increase in RSV and influenza rates and associated hospitalizations, children ages 9 and under may not visit patients in [facility] hospitals.  Visitors with respiratory illnesses are discouraged from visiting and should remain at home.  If the patient needs to stay at the hospital during part of their recovery, the visitor guidelines for inpatient rooms apply. Pre-op nurse will coordinate an appropriate time for 1 support person to accompany patient in pre-op.  This support person may not rotate.    Please refer to the [facility] website for the visitor guidelines for Inpatients (after your surgery is over and you are in a regular room).       Your procedure is scheduled on: 04/15/23   Report to [facility] Main Entrance    Report to admitting at  11:35 AM   Call this number if you have problems the morning of surgery [phone]   Do not eat food :After Midnight.   After Midnight you may have the following liquids until 10:50 AM DAY OF SURGERY  Water Non-Citrus Juices (without pulp, NO RED-Apple, White grape, White cranberry) Black Coffee (NO MILK/CREAM OR CREAMERS, sugar ok)  Clear Tea (NO MILK/CREAM OR CREAMERS, sugar ok) regular and decaf                             Plain Jell-O (NO RED)                                           Fruit ices (not with fruit pulp, NO RED)                                     Popsicles (NO RED)                                                               Sports drinks like Gatorade (NO RED)                FOLLOW BOWEL PREP AND ANY ADDITIONAL PRE OP INSTRUCTIONS YOU RECEIVED FROM YOUR SURGEON'S OFFICE!!!     Oral Hygiene is also important to reduce your risk of infection.                                     Remember - BRUSH YOUR TEETH THE MORNING OF SURGERY WITH YOUR REGULAR TOOTHPASTE   Stop all vitamins and herbal supplements 7 days before surgery.   Take these medicines the morning of surgery with A SIP OF WATER: Hygroton(Chlorthalidone), Metoprolol, Rosuvastatin             You may not have any metal on your body including hair pins, jewelry, and body  piercing             Do not wear make-up, lotions, powders, perfumes/cologne, or deodorant              Men may shave face and neck.   Do not bring valuables to the hospital. [place] IS NOT             RESPONSIBLE   FOR VALUABLES.   Contacts, glasses, dentures or bridgework may not be worn into surgery.   Bring small overnight bag day of surgery.   DO NOT BRING YOUR HOME MEDICATIONS TO THE HOSPITAL. PHARMACY WILL DISPENSE MEDICATIONS LISTED ON YOUR MEDICATION LIST TO YOU DURING YOUR ADMISSION IN THE HOSPITAL!    Patients discharged on the day of surgery will not be allowed to drive home.  Someone NEEDS to stay with you for the first 24 hours after anesthesia.   Special Instructions: Bring a copy of your healthcare power of attorney and living will documents the day of surgery if you haven't scanned them before.              Please read over the following fact sheets you were given: IF YOU HAVE QUESTIONS ABOUT YOUR PRE-OP INSTRUCTIONS PLEASE CALL [phone] Rosey Bath   If you received a COVID test during your pre-op visit  it is requested that you wear a mask when out in public, stay away from anyone that may not be feeling well and notify your surgeon if you develop symptoms. If you test positive for Covid or have been in contact with anyone that has tested positive in the last 10 days please notify you surgeon.    [facility] - Preparing for Surgery Before surgery, you can play an important role.  Because skin is not sterile, your skin needs to be as free of germs as possible.  You can reduce the number  of germs on your skin by washing with CHG (chlorahexidine gluconate) soap before surgery.  CHG is an antiseptic cleaner which kills germs and bonds with the skin to continue killing germs even after washing. Please DO NOT use if you have an allergy to CHG or antibacterial soaps.  If your skin becomes reddened/irritated stop using the CHG and inform your nurse when you arrive at Short Stay. Do not shave (including legs and underarms) for at least 48 hours prior to the first CHG shower.  You may shave your face/neck.  Please follow these instructions carefully:  1.  Shower with CHG Soap the night before surgery and the  morning of surgery.  2.  If you choose to wash your hair, wash your hair first as usual with your normal  shampoo.  3.  After you shampoo, rinse your hair and body thoroughly to remove the shampoo.                             4.  Use CHG as you would any other liquid soap.  You can apply chg directly to the skin and wash.  Gently with a scrungie or clean washcloth.  5.  Apply the CHG Soap to your body ONLY FROM THE NECK DOWN.   Do   not use on face/ open                           Wound or open sores. Avoid contact with eyes, ears mouth and   genitals (  private parts).                       Wash face,  Genitals (private parts) with your normal soap.             6.  Wash thoroughly, paying special attention to the area where your    surgery  will be performed.  7.  Thoroughly rinse your body with warm water from the neck down.  8.  DO NOT shower/wash with your normal soap after using and rinsing off the CHG Soap.                9.  Pat yourself dry with a clean towel.            10.  Wear clean pajamas.            11.  Place clean sheets on your bed the night of your first shower and do not  sleep with pets. Day of Surgery : Do not apply any lotions/deodorants the morning of surgery.  Please wear clean clothes to the hospital/surgery center.  FAILURE TO FOLLOW THESE INSTRUCTIONS MAY  RESULT IN THE CANCELLATION OF YOUR SURGERY  PATIENT SIGNATURE_________________________________  NURSE SIGNATURE__________________________________  ________________________________________________________________________

## 2023-04-04 NOTE — Progress Notes (Addendum)
COVID Vaccine received:  [x]  No []  Yes Date of any COVID positive Test in last 90 days: no PCP - Dr. Elias Else Cardiologist - Dr. Peter Swaziland  Cardiac clearance 02/07/23-Emily Monge NP  Chest x-ray -  EKG -  04/19/22 Epic Stress Test - 03/2016  ECHO - 04/20/16 Epic Cardiac Cath -   Bowel Prep - [x]  No  []   Yes ______  Pacemaker / ICD device [x]  No []  Yes   Spinal Cord Stimulator:[x]  No []  Yes       History of Sleep Apnea? [x]  No []  Yes   CPAP used?- [x]  No []  Yes    Does the patient monitor blood sugar?          [x]  No []  Yes  []  N/A  Patient has: [x]  NO Hx DM   []  Pre-DM                 []  DM1  []   DM2 Does patient have a Freestyle Libre or Dexacom? []  No []  Yes   Fasting Blood Sugar Ranges-  Checks Blood Sugar _____ times a day  GLP1 agonist / usual dose - no GLP1 instructions:  SGLT-2 inhibitors / usual dose - no SGLT-2 instructions:   Blood Thinner / Instructions:no Aspirin Instructions:Hold ASA 5 days prior to surgery. Last dose 04/09/23 am  Comments:   Activity level: Patient is able to climb a flight of stairs without difficulty; [x]  No CP  [x]  No SOB, _   Patient can  perform ADLs without assistance.   Anesthesia review: HTN, DM, Hypertrophic cardiomyopathy, Ischemic heart disease.  Patient denies shortness of breath, fever, cough and chest pain at PAT appointment.  Patient verbalized understanding and agreement to the Pre-Surgical Instructions that were given to them at this PAT appointment. Patient was also educated of the need to review these PAT instructions again prior to his/her surgery.I reviewed the appropriate phone numbers to call if they have any and questions or concerns.

## 2023-04-05 ENCOUNTER — Encounter (HOSPITAL_COMMUNITY)
Admission: RE | Admit: 2023-04-05 | Discharge: 2023-04-05 | Disposition: A | Discharge status: Home / Self Care | Payer: PPO | Source: Ambulatory Visit | Attending: Surgery | Admitting: Surgery

## 2023-04-05 ENCOUNTER — Encounter (HOSPITAL_COMMUNITY): Payer: Self-pay

## 2023-04-05 ENCOUNTER — Other Ambulatory Visit: Payer: Self-pay

## 2023-04-05 VITALS — BP 131/68 | HR 56 | Temp 98.0°F | Resp 16 | Ht 73.0 in | Wt 240.0 lb

## 2023-04-05 DIAGNOSIS — I491 Atrial premature depolarization: Secondary | ICD-10-CM | POA: Diagnosis not present

## 2023-04-05 DIAGNOSIS — E119 Type 2 diabetes mellitus without complications: Secondary | ICD-10-CM | POA: Diagnosis not present

## 2023-04-05 DIAGNOSIS — I10 Essential (primary) hypertension: Secondary | ICD-10-CM | POA: Insufficient documentation

## 2023-04-05 DIAGNOSIS — I440 Atrioventricular block, first degree: Secondary | ICD-10-CM | POA: Diagnosis not present

## 2023-04-05 DIAGNOSIS — Z01818 Encounter for other preprocedural examination: Secondary | ICD-10-CM | POA: Insufficient documentation

## 2023-04-05 DIAGNOSIS — R001 Bradycardia, unspecified: Secondary | ICD-10-CM | POA: Diagnosis not present

## 2023-04-05 HISTORY — DX: Essential (primary) hypertension: I10

## 2023-04-05 LAB — HEMOGLOBIN A1C
Hgb A1c MFr Bld: 6.7 % — ABNORMAL HIGH (ref 4.8–5.6)
Mean Plasma Glucose: 145.59 mg/dL

## 2023-04-05 LAB — CBC
HCT: 41.6 % (ref 39.0–52.0)
Hemoglobin: 13.9 g/dL (ref 13.0–17.0)
MCH: 31.1 pg (ref 26.0–34.0)
MCHC: 33.4 g/dL (ref 30.0–36.0)
MCV: 93.1 fL (ref 80.0–100.0)
Platelets: 119 10*3/uL — ABNORMAL LOW (ref 150–400)
RBC: 4.47 MIL/uL (ref 4.22–5.81)
RDW: 12.9 % (ref 11.5–15.5)
WBC: 5.2 10*3/uL (ref 4.0–10.5)
nRBC: 0 % (ref 0.0–0.2)

## 2023-04-05 LAB — BASIC METABOLIC PANEL
Anion gap: 10 (ref 5–15)
BUN: 22 mg/dL (ref 8–23)
CO2: 26 mmol/L (ref 22–32)
Calcium: 9.8 mg/dL (ref 8.9–10.3)
Chloride: 104 mmol/L (ref 98–111)
Creatinine, Ser: 1.48 mg/dL — ABNORMAL HIGH (ref 0.61–1.24)
GFR, Estimated: 50 mL/min — ABNORMAL LOW (ref >60)
Glucose, Bld: 133 mg/dL — ABNORMAL HIGH (ref 70–99)
Potassium: 4.5 mmol/L (ref 3.5–5.1)
Sodium: 140 mmol/L (ref 135–145)

## 2023-04-07 NOTE — Anesthesia Preprocedure Evaluation (Addendum)
 Anesthesia Evaluation  Patient identified by MRN, date of birth, ID band Patient awake    Reviewed: Allergy & Precautions, NPO status , Patient's Chart, lab work & pertinent test results  History of Anesthesia Complications (+) PONV and history of anesthetic complications  Airway Mallampati: II  TM Distance: >3 FB Neck ROM: Full    Dental  (+) Teeth Intact, Dental Advisory Given   Pulmonary    breath sounds clear to auscultation       Cardiovascular hypertension, Pt. on home beta blockers + CAD   Rhythm:Regular Rate:Bradycardia  Echo: - Left ventricle: The cavity size was normal. There was severe    concentric hypertrophy. Systolic function was normal. The    estimated ejection fraction was in the range of 55% to 60%. Wall    motion was normal; there were no regional wall motion    abnormalities. Doppler parameters are consistent with a    reversible restrictive pattern, indicative of decreased left    ventricular diastolic compliance and/or increased left atrial    pressure (grade 3 diastolic dysfunction). Doppler parameters are    consistent with high ventricular filling pressure.  - Aortic valve: Valve mobility was restricted. There was mild    stenosis. There was no regurgitation. Peak velocity (S): 265    cm/s. Mean gradient (S): 14 mm Hg. Peak gradient (S): 28 mm Hg.  - Mitral valve: Transvalvular velocity was within the normal range.    There was no evidence for stenosis. There was trivial    regurgitation.  - Right ventricle: The cavity size was normal. Wall thickness was    normal. Systolic function was normal.  - Atrial septum: No defect or patent foramen ovale was identified    by color flow Doppler.  - Tricuspid valve: There was no regurgitation.     Neuro/Psych    GI/Hepatic Neg liver ROS, hiatal hernia,GERD  ,,  Endo/Other  negative endocrine ROS    Renal/GU Renal InsufficiencyRenal disease      Musculoskeletal  (+) Arthritis ,    Abdominal   Peds  Hematology negative hematology ROS (+)   Anesthesia Other Findings   Reproductive/Obstetrics                             Anesthesia Physical Anesthesia Plan  ASA: 3  Anesthesia Plan: General   Post-op Pain Management: Tylenol PO (pre-op)*, Celebrex PO (pre-op)* and Gabapentin PO (pre-op)*   Induction: Intravenous  PONV Risk Score and Plan: 4 or greater and Ondansetron, Dexamethasone, TIVA and Treatment may vary due to age or medical condition  Airway Management Planned: Oral ETT  Additional Equipment: None  Intra-op Plan:   Post-operative Plan: Extubation in OR  Informed Consent: I have reviewed the patients History and Physical, chart, labs and discussed the procedure including the risks, benefits and alternatives for the proposed anesthesia with the patient or authorized representative who has indicated his/her understanding and acceptance.     Dental advisory given  Plan Discussed with: CRNA  Anesthesia Plan Comments: (See PAT note from 2/11 by Sherlie Ban PA-C )        Anesthesia Quick Evaluation

## 2023-04-07 NOTE — Progress Notes (Signed)
Case: [id number] Date/Time: 04/15/23 1336   Procedure: XI ROBOTIC INCISIONAL HERNIA REPAIR WITH MESH, BILATERAL POSTERIOR RECTUS MYOFASCIAL RELEASE, POSSIBLE BILATERAL TRANSVERSUS ABDOMINIS RELEASE POSSIBLE REMOVAL OF INTRAPERITONEAL MESH   Anesthesia type: General   Pre-op diagnosis: INCISIONAL HERNIA   [facility] ROOM [facility] / [facility]   Surgeons: Stechschulte, Hyman Hopes, MD       DISCUSSION: Lucas Buckley is a 73 year old male who presents to PAT prior to surgery above.  Past medical history significant for hypertension, CAD s/p PCI in 1999, hypertrophic cardiomyopathy, aortic stenosis, GERD, hiatal hernia, diabetes (A1c 6.7 on 2/11), arthritis, prostate cancer status post prostatectomy (2013)  Prior anesthesia complications include PONV  Patient follows with cardiology for history of CAD, HCM, mild AS, and hypertension. Last echo in 2018 showed normal EF with severe concentric hypertrophy, no outflow gradient, mild AS (Mean gradient (S): 14 mm Hg). Patient last seen in clinic on 04/19/2022 by Dr. Swaziland.  At that visit he reported to be feeling very well with no symptoms.  Advised to follow-up in 1 year.  Patient had televisit for preop clearance on 02/07/2023.  He denied any symptoms. Cleared for surgery:  "Preoperative Cardiovascular Risk Assessment:   According to the Revised Cardiac Risk Index (RCRI), his Perioperative Risk of Major Cardiac Event is (%): 0.9. His Functional Capacity in METs is: 7.34 according to the [facility] Activity Status Index (DASI). Therefore, based on ACC/AHA guidelines, patient would be at acceptable risk for the planned procedure without further cardiovascular testing.  Per Dr. Swaziland patient can hold Aspirin for 5 days prior to procedure. Please resume Aspirin as soon as possible postprocedure, at the discretion of the surgeon."  Of note patient's kidney function was elevated to 1.48. Discussed with patient - he states that he sometimes takes Indomethacin for his back -  probably once a week. He also tries to stay hydrated. Also A1c was 6.7 which is down from previous values. He denies being told he has diabetes. He reports he's lost about 11 pounds recently on purpose for his upcoming surgery. He hasn't seen his PCP in some time. He was advised to f/u with PCP to have recheck of kidney function and to discuss management of diabetes.  VS: BP 131/68   Pulse (!) 56   Temp 36.7 C (Oral)   Resp 16   Ht 6\' 1"  (1.854 m)   Wt 108.9 kg   SpO2 98%   BMI 31.66 kg/m   PROVIDERS: Elias Else, MD Cardiology: Peter Swaziland, MD  LABS: Labs reviewed: Acceptable for surgery. Mild AKI, HgA1c elevated - discussed with patient (all labs ordered are listed, but only abnormal results are displayed)  Labs Reviewed  HEMOGLOBIN A1C - Abnormal; Notable for the following components:      Result Value   Hgb A1c MFr Bld 6.7 (*)    All other components within normal limits  BASIC METABOLIC PANEL - Abnormal; Notable for the following components:   Glucose, Bld 133 (*)    Creatinine, Ser 1.48 (*)    GFR, Estimated 50 (*)    All other components within normal limits  CBC - Abnormal; Notable for the following components:   Platelets 119 (*)    All other components within normal limits     IMAGES:   EKG 04/05/23:  Sinus bradycardia with 1st degree A-V block with Premature supraventricular complexes, rate 53 Left axis deviation Cannot rule out Anterior infarct , age undetermined  CV:  Echo 04/20/2016:  Study Conclusions  -  Left ventricle: The cavity size was normal. There was severe   concentric hypertrophy. Systolic function was normal. The   estimated ejection fraction was in the range of 55% to 60%. Wall   motion was normal; there were no regional wall motion   abnormalities. Doppler parameters are consistent with a   reversible restrictive pattern, indicative of decreased left   ventricular diastolic compliance and/or increased left atrial   pressure (grade  3 diastolic dysfunction). Doppler parameters are   consistent with high ventricular filling pressure. - Aortic valve: Valve mobility was restricted. There was mild   stenosis. There was no regurgitation. Peak velocity (S): 265   cm/s. Mean gradient (S): 14 mm Hg. Peak gradient (S): 28 mm Hg. - Mitral valve: Transvalvular velocity was within the normal range.   There was no evidence for stenosis. There was trivial   regurgitation. - Right ventricle: The cavity size was normal. Wall thickness was   normal. Systolic function was normal. - Atrial septum: No defect or patent foramen ovale was identified   by color flow Doppler. - Tricuspid valve: There was no regurgitation.  Stress test 04/14/2016:  The left ventricular ejection fraction is moderately decreased (30-44%). Nuclear stress EF: 44%. There was no ST segment deviation noted during stress. Blood pressure demonstrated a hypertensive response to exercise. This is an intermediate risk study.   1. EF 44%, diffuse hypokinesis.  2. Fixed, small, mild apical lateral perfusion defect.  Possible attenuation, but cannot rule out small area of infarction.  No ischemia.  3. Intermediate risk study due to low EF.     Past Medical History:  Diagnosis Date   Arthritis    knee and sholders   Cancer (HCC)    basal cell/ left cheek/    PROSTATE   Coronary artery disease    ischemic heart disease-  stents x 2- 1999--LOV with clearance and note Dr Patty Sermons, EKG in EPIC   GERD (gastroesophageal reflux disease)    H/O hiatal hernia    Hypercholesterolemia    Hypertension    PONV (postoperative nausea and vomiting)    Renal stones     Past Surgical History:  Procedure Laterality Date   CARDIAC CATHETERIZATION     1999   CORONARY ANGIOPLASTY     FRACTURE SURGERY  1974   left ankle and lower leg   HERNIA REPAIR     umbilical   LITHOTRIPSY     ROBOT ASSISTED LAPAROSCOPIC RADICAL PROSTATECTOMY  03/29/2011   Procedure: ROBOTIC ASSISTED  LAPAROSCOPIC RADICAL PROSTATECTOMY LEVEL 2;  Surgeon: Crecencio Mc, MD;  Location: [facility];  Service: Urology;  Laterality: N/A;  BILATERAL PELVIC LYMPHADENECTOMY       testicular     tortion repair in teens    MEDICATIONS:  alum hydroxide-mag trisilicate (GAVISCON) 80-20 MG CHEW chewable tablet   aspirin EC 81 MG tablet   chlorthalidone (HYGROTON) 25 MG tablet   metoprolol succinate (TOPROL-XL) 100 MG 24 hr tablet   Multiple Vitamin (MULTIVITAMIN WITH MINERALS) TABS tablet   nitroGLYCERIN (NITROSTAT) 0.4 MG SL tablet   rosuvastatin (CRESTOR) 40 MG tablet   valsartan (DIOVAN) 160 MG tablet   No current facility-administered medications for this encounter.   Marcille Blanco [facility] Short Stay/Anesthesiology [facility] Phone [phone] 04/07/2023 10:12 AM

## 2023-04-15 ENCOUNTER — Ambulatory Visit (HOSPITAL_COMMUNITY): Payer: PPO | Admitting: Medical

## 2023-04-15 ENCOUNTER — Other Ambulatory Visit: Payer: Self-pay

## 2023-04-15 ENCOUNTER — Encounter (HOSPITAL_COMMUNITY)
Admission: RE | Disposition: A | Discharge status: Home / Self Care | Payer: Self-pay | Source: Home / Self Care | Attending: Surgery

## 2023-04-15 ENCOUNTER — Ambulatory Visit (HOSPITAL_COMMUNITY)
Admission: RE | Admit: 2023-04-15 | Discharge: 2023-04-16 | Disposition: A | Discharge status: Home / Self Care | Payer: PPO | Attending: Surgery | Admitting: Surgery

## 2023-04-15 ENCOUNTER — Ambulatory Visit (HOSPITAL_BASED_OUTPATIENT_CLINIC_OR_DEPARTMENT_OTHER): Payer: PPO | Admitting: Anesthesiology

## 2023-04-15 ENCOUNTER — Encounter (HOSPITAL_COMMUNITY): Payer: Self-pay | Admitting: Surgery

## 2023-04-15 DIAGNOSIS — I350 Nonrheumatic aortic (valve) stenosis: Secondary | ICD-10-CM | POA: Diagnosis not present

## 2023-04-15 DIAGNOSIS — K439 Ventral hernia without obstruction or gangrene: Secondary | ICD-10-CM | POA: Diagnosis present

## 2023-04-15 DIAGNOSIS — I2510 Atherosclerotic heart disease of native coronary artery without angina pectoris: Secondary | ICD-10-CM | POA: Diagnosis not present

## 2023-04-15 DIAGNOSIS — K219 Gastro-esophageal reflux disease without esophagitis: Secondary | ICD-10-CM | POA: Insufficient documentation

## 2023-04-15 DIAGNOSIS — I10 Essential (primary) hypertension: Secondary | ICD-10-CM | POA: Diagnosis not present

## 2023-04-15 DIAGNOSIS — M1990 Unspecified osteoarthritis, unspecified site: Secondary | ICD-10-CM | POA: Diagnosis not present

## 2023-04-15 DIAGNOSIS — K449 Diaphragmatic hernia without obstruction or gangrene: Secondary | ICD-10-CM | POA: Insufficient documentation

## 2023-04-15 DIAGNOSIS — R001 Bradycardia, unspecified: Secondary | ICD-10-CM | POA: Diagnosis not present

## 2023-04-15 DIAGNOSIS — K430 Incisional hernia with obstruction, without gangrene: Secondary | ICD-10-CM

## 2023-04-15 DIAGNOSIS — Z79899 Other long term (current) drug therapy: Secondary | ICD-10-CM | POA: Insufficient documentation

## 2023-04-15 DIAGNOSIS — Z7982 Long term (current) use of aspirin: Secondary | ICD-10-CM | POA: Diagnosis not present

## 2023-04-15 DIAGNOSIS — K432 Incisional hernia without obstruction or gangrene: Secondary | ICD-10-CM

## 2023-04-15 DIAGNOSIS — E119 Type 2 diabetes mellitus without complications: Secondary | ICD-10-CM

## 2023-04-15 HISTORY — PX: XI ROBOTIC ASSISTED VENTRAL HERNIA: SHX6789

## 2023-04-15 LAB — CBC
HCT: 40.5 % (ref 39.0–52.0)
Hemoglobin: 13.4 g/dL (ref 13.0–17.0)
MCH: 31.2 pg (ref 26.0–34.0)
MCHC: 33.1 g/dL (ref 30.0–36.0)
MCV: 94.2 fL (ref 80.0–100.0)
Platelets: 100 10*3/uL — ABNORMAL LOW (ref 150–400)
RBC: 4.3 MIL/uL (ref 4.22–5.81)
RDW: 13.4 % (ref 11.5–15.5)
WBC: 6.7 10*3/uL (ref 4.0–10.5)
nRBC: 0 % (ref 0.0–0.2)

## 2023-04-15 LAB — GLUCOSE, CAPILLARY: Glucose-Capillary: 119 mg/dL — ABNORMAL HIGH (ref 70–99)

## 2023-04-15 LAB — CREATININE, SERUM
Creatinine, Ser: 1.35 mg/dL — ABNORMAL HIGH (ref 0.61–1.24)
GFR, Estimated: 55 mL/min — ABNORMAL LOW (ref >60)

## 2023-04-15 SURGERY — REPAIR, HERNIA, VENTRAL, ROBOT-ASSISTED
Anesthesia: General

## 2023-04-15 MED ORDER — ROCURONIUM BROMIDE 10 MG/ML (PF) SYRINGE
PREFILLED_SYRINGE | INTRAVENOUS | Status: DC | PRN
  Administered 2023-04-15: 60 mg via INTRAVENOUS
  Administered 2023-04-15: 10 mg via INTRAVENOUS

## 2023-04-15 MED ORDER — CHLORHEXIDINE GLUCONATE CLOTH 2 % EX PADS: 6.0000 | MEDICATED_PAD | Freq: Once | CUTANEOUS | Status: DC

## 2023-04-15 MED ORDER — 0.9 % SODIUM CHLORIDE (POUR BTL) OPTIME
TOPICAL | Status: DC | PRN
  Administered 2023-04-15: 1000 mL

## 2023-04-15 MED ORDER — PHENYLEPHRINE HCL-NACL 20-0.9 MG/250ML-% IV SOLN
INTRAVENOUS | Status: AC
  Filled 2023-04-15: qty 250

## 2023-04-15 MED ORDER — ORAL CARE MOUTH RINSE: 15.0000 mL | Freq: Once | OROMUCOSAL | Status: AC

## 2023-04-15 MED ORDER — CELECOXIB 200 MG PO CAPS
400.0000 mg | ORAL_CAPSULE | ORAL | Status: AC
  Administered 2023-04-15: 400 mg via ORAL
  Filled 2023-04-15: qty 2

## 2023-04-15 MED ORDER — DEXAMETHASONE SODIUM PHOSPHATE 10 MG/ML IJ SOLN
INTRAMUSCULAR | Status: AC
  Filled 2023-04-15: qty 1

## 2023-04-15 MED ORDER — KETAMINE HCL 10 MG/ML IJ SOLN
INTRAMUSCULAR | Status: DC | PRN
  Administered 2023-04-15: 20 mg via INTRAVENOUS
  Administered 2023-04-15: 10 mg via INTRAVENOUS

## 2023-04-15 MED ORDER — DOCUSATE SODIUM 100 MG PO CAPS
100.0000 mg | ORAL_CAPSULE | Freq: Two times a day (BID) | ORAL | Status: DC
  Administered 2023-04-15 – 2023-04-16 (×2): 100 mg via ORAL
  Filled 2023-04-15 (×2): qty 1

## 2023-04-15 MED ORDER — LIDOCAINE 2% (20 MG/ML) 5 ML SYRINGE
INTRAMUSCULAR | Status: DC | PRN
  Administered 2023-04-15: 1.5 mg/kg/h via INTRAVENOUS
  Administered 2023-04-15: 50 mg via INTRAVENOUS

## 2023-04-15 MED ORDER — PROPOFOL 1000 MG/100ML IV EMUL
INTRAVENOUS | Status: AC
  Filled 2023-04-15: qty 100

## 2023-04-15 MED ORDER — GABAPENTIN 300 MG PO CAPS
300.0000 mg | ORAL_CAPSULE | ORAL | Status: AC
  Administered 2023-04-15: 300 mg via ORAL
  Filled 2023-04-15: qty 1

## 2023-04-15 MED ORDER — ONDANSETRON HCL 4 MG/2ML IJ SOLN: 4.0000 mg | Freq: Four times a day (QID) | INTRAMUSCULAR | Status: DC | PRN

## 2023-04-15 MED ORDER — CHLORTHALIDONE 25 MG PO TABS
25.0000 mg | ORAL_TABLET | Freq: Every day | ORAL | Status: DC
  Administered 2023-04-16: 25 mg via ORAL
  Filled 2023-04-15: qty 1

## 2023-04-15 MED ORDER — CHLORHEXIDINE GLUCONATE 0.12 % MT SOLN
15.0000 mL | Freq: Once | OROMUCOSAL | Status: AC
  Administered 2023-04-15: 15 mL via OROMUCOSAL

## 2023-04-15 MED ORDER — BUPIVACAINE-EPINEPHRINE 0.25% -1:200000 IJ SOLN
INTRAMUSCULAR | Status: DC | PRN
  Administered 2023-04-15: 50 mL

## 2023-04-15 MED ORDER — ASPIRIN 81 MG PO TBEC
162.0000 mg | DELAYED_RELEASE_TABLET | Freq: Every day | ORAL | Status: DC
  Administered 2023-04-16: 162 mg via ORAL
  Filled 2023-04-15: qty 2

## 2023-04-15 MED ORDER — BUPIVACAINE LIPOSOME 1.3 % IJ SUSP
INTRAMUSCULAR | Status: AC
  Filled 2023-04-15: qty 20

## 2023-04-15 MED ORDER — BUPIVACAINE-EPINEPHRINE 0.25% -1:200000 IJ SOLN
INTRAMUSCULAR | Status: AC
  Filled 2023-04-15: qty 1

## 2023-04-15 MED ORDER — DEXAMETHASONE SODIUM PHOSPHATE 10 MG/ML IJ SOLN
INTRAMUSCULAR | Status: DC | PRN
  Administered 2023-04-15: 10 mg via INTRAVENOUS

## 2023-04-15 MED ORDER — PROPOFOL 10 MG/ML IV BOLUS
INTRAVENOUS | Status: DC | PRN
  Administered 2023-04-15: 30 mg via INTRAVENOUS
  Administered 2023-04-15: 100 ug/kg/min via INTRAVENOUS
  Administered 2023-04-15: 150 mg via INTRAVENOUS

## 2023-04-15 MED ORDER — SUGAMMADEX SODIUM 200 MG/2ML IV SOLN
INTRAVENOUS | Status: DC | PRN
Start: 2023-04-15 — End: 2023-04-15
  Administered 2023-04-15: 150 mg via INTRAVENOUS

## 2023-04-15 MED ORDER — LACTATED RINGERS IV SOLN: INTRAVENOUS | Status: DC

## 2023-04-15 MED ORDER — PROCHLORPERAZINE EDISYLATE 10 MG/2ML IJ SOLN: 10.0000 mg | INTRAMUSCULAR | Status: DC | PRN

## 2023-04-15 MED ORDER — MIDAZOLAM HCL 2 MG/2ML IJ SOLN
INTRAMUSCULAR | Status: DC | PRN
Start: 2023-04-15 — End: 2023-04-15
  Administered 2023-04-15: 2 mg via INTRAVENOUS

## 2023-04-15 MED ORDER — MIDAZOLAM HCL 2 MG/2ML IJ SOLN
INTRAMUSCULAR | Status: AC
  Filled 2023-04-15: qty 2

## 2023-04-15 MED ORDER — OXYCODONE-ACETAMINOPHEN 5-325 MG PO TABS: 1.0000 | ORAL_TABLET | ORAL | 0 refills | Status: DC | PRN

## 2023-04-15 MED ORDER — IRBESARTAN 150 MG PO TABS
150.0000 mg | ORAL_TABLET | Freq: Every day | ORAL | Status: DC
  Administered 2023-04-16: 150 mg via ORAL
  Filled 2023-04-15: qty 1

## 2023-04-15 MED ORDER — FENTANYL CITRATE (PF) 250 MCG/5ML IJ SOLN
INTRAMUSCULAR | Status: DC | PRN
  Administered 2023-04-15 (×5): 50 ug via INTRAVENOUS

## 2023-04-15 MED ORDER — KETAMINE HCL 50 MG/5ML IJ SOSY
PREFILLED_SYRINGE | INTRAMUSCULAR | Status: AC
  Filled 2023-04-15: qty 5

## 2023-04-15 MED ORDER — CEFAZOLIN SODIUM-DEXTROSE 2-4 GM/100ML-% IV SOLN
2.0000 g | INTRAVENOUS | Status: AC
  Administered 2023-04-15: 2 g via INTRAVENOUS
  Filled 2023-04-15: qty 100

## 2023-04-15 MED ORDER — PHENYLEPHRINE HCL-NACL 20-0.9 MG/250ML-% IV SOLN
INTRAVENOUS | Status: DC | PRN
  Administered 2023-04-15: 15 ug/min via INTRAVENOUS

## 2023-04-15 MED ORDER — OXYCODONE HCL 5 MG PO TABS: 10.0000 mg | ORAL_TABLET | ORAL | Status: DC | PRN

## 2023-04-15 MED ORDER — ROCURONIUM BROMIDE 10 MG/ML (PF) SYRINGE
PREFILLED_SYRINGE | INTRAVENOUS | Status: AC
Start: 2023-04-15 — End: ?
  Filled 2023-04-15: qty 10

## 2023-04-15 MED ORDER — FENTANYL CITRATE (PF) 250 MCG/5ML IJ SOLN
INTRAMUSCULAR | Status: AC
  Filled 2023-04-15: qty 5

## 2023-04-15 MED ORDER — PROPOFOL 500 MG/50ML IV EMUL
INTRAVENOUS | Status: AC
  Filled 2023-04-15: qty 50

## 2023-04-15 MED ORDER — OXYCODONE HCL 5 MG PO TABS: 5.0000 mg | ORAL_TABLET | ORAL | Status: DC | PRN

## 2023-04-15 MED ORDER — ACETAMINOPHEN 325 MG PO TABS
650.0000 mg | ORAL_TABLET | Freq: Four times a day (QID) | ORAL | Status: DC
  Administered 2023-04-15 – 2023-04-16 (×3): 650 mg via ORAL
  Filled 2023-04-15 (×3): qty 2

## 2023-04-15 MED ORDER — ONDANSETRON HCL 4 MG/2ML IJ SOLN
INTRAMUSCULAR | Status: AC
  Filled 2023-04-15: qty 2

## 2023-04-15 MED ORDER — INSULIN ASPART 100 UNIT/ML IJ SOLN: 0.0000 [IU] | INTRAMUSCULAR | Status: DC | PRN

## 2023-04-15 MED ORDER — METHOCARBAMOL 1000 MG/10ML IJ SOLN: 500.0000 mg | Freq: Four times a day (QID) | INTRAMUSCULAR | Status: DC | PRN

## 2023-04-15 MED ORDER — LIDOCAINE HCL (PF) 2 % IJ SOLN
INTRAMUSCULAR | Status: AC
  Filled 2023-04-15: qty 5

## 2023-04-15 MED ORDER — ACETAMINOPHEN 500 MG PO TABS
1000.0000 mg | ORAL_TABLET | ORAL | Status: AC
  Administered 2023-04-15: 1000 mg via ORAL
  Filled 2023-04-15: qty 2

## 2023-04-15 MED ORDER — ENOXAPARIN SODIUM 40 MG/0.4ML IJ SOSY
40.0000 mg | PREFILLED_SYRINGE | INTRAMUSCULAR | Status: DC
  Administered 2023-04-16: 40 mg via SUBCUTANEOUS
  Filled 2023-04-15: qty 0.4

## 2023-04-15 MED ORDER — ROSUVASTATIN CALCIUM 20 MG PO TABS
40.0000 mg | ORAL_TABLET | Freq: Every day | ORAL | Status: DC
  Administered 2023-04-16: 40 mg via ORAL
  Filled 2023-04-15: qty 2

## 2023-04-15 MED ORDER — BUPIVACAINE LIPOSOME 1.3 % IJ SUSP
INTRAMUSCULAR | Status: DC | PRN
  Administered 2023-04-15: 20 mL

## 2023-04-15 MED ORDER — PROPOFOL 10 MG/ML IV BOLUS
INTRAVENOUS | Status: AC
  Filled 2023-04-15: qty 20

## 2023-04-15 MED ORDER — METOPROLOL SUCCINATE ER 50 MG PO TB24
100.0000 mg | ORAL_TABLET | Freq: Every day | ORAL | Status: DC
  Administered 2023-04-16: 100 mg via ORAL
  Filled 2023-04-15: qty 2

## 2023-04-15 MED ORDER — ONDANSETRON HCL 4 MG/2ML IJ SOLN
INTRAMUSCULAR | Status: DC | PRN
  Administered 2023-04-15: 4 mg via INTRAVENOUS

## 2023-04-15 MED ORDER — SIMETHICONE 80 MG PO CHEW: 80.0000 mg | CHEWABLE_TABLET | Freq: Four times a day (QID) | ORAL | Status: DC | PRN

## 2023-04-15 MED ORDER — HYDROMORPHONE HCL 1 MG/ML IJ SOLN: 0.5000 mg | INTRAMUSCULAR | Status: DC | PRN

## 2023-04-15 MED ORDER — BUPIVACAINE LIPOSOME 1.3 % IJ SUSP: 20.0000 mL | Freq: Once | INTRAMUSCULAR | Status: DC

## 2023-04-15 MED ORDER — GABAPENTIN 300 MG PO CAPS
300.0000 mg | ORAL_CAPSULE | Freq: Three times a day (TID) | ORAL | Status: DC
  Administered 2023-04-15 – 2023-04-16 (×2): 300 mg via ORAL
  Filled 2023-04-15: qty 1
  Filled 2023-04-15: qty 3

## 2023-04-15 SURGICAL SUPPLY — 62 items
ANTIFOG SOL W/FOAM PAD STRL (MISCELLANEOUS) ×1 IMPLANT
BAG COUNTER SPONGE SURGICOUNT (BAG) ×1 IMPLANT
BLADE SURG SZ11 CARB STEEL (BLADE) ×1 IMPLANT
CHLORAPREP W/TINT 26 (MISCELLANEOUS) ×1 IMPLANT
COVER MAYO STAND STRL (DRAPES) ×1 IMPLANT
COVER SURGICAL LIGHT HANDLE (MISCELLANEOUS) ×1 IMPLANT
COVER TIP SHEARS 8 DVNC (MISCELLANEOUS) ×1 IMPLANT
DERMABOND ADVANCED .7 DNX12 (GAUZE/BANDAGES/DRESSINGS) IMPLANT
DEVICE TROCAR PUNCTURE CLOSURE (ENDOMECHANICALS) IMPLANT
DRAPE ARM DVNC X/XI (DISPOSABLE) ×3 IMPLANT
DRAPE COLUMN DVNC XI (DISPOSABLE) ×1 IMPLANT
DRIVER NDL LRG 8 DVNC XI (INSTRUMENTS) ×2 IMPLANT
DRIVER NDL MEGA SUTCUT DVNCXI (INSTRUMENTS) ×2 IMPLANT
DRIVER NDLE LRG 8 DVNC XI (INSTRUMENTS) ×2 IMPLANT
DRIVER NDLE MEGA SUTCUT DVNCXI (INSTRUMENTS) ×2 IMPLANT
ELECT L-HOOK LAP 45CM DISP (ELECTROSURGICAL) ×1 IMPLANT
ELECT PENCIL ROCKER SW 15FT (MISCELLANEOUS) ×1 IMPLANT
ELECT REM PT RETURN 15FT ADLT (MISCELLANEOUS) ×1 IMPLANT
ELECTRODE L-HOOK LAP 45CM DISP (ELECTROSURGICAL) ×1 IMPLANT
FORCEPS PROGRASP DVNC XI (FORCEP) ×1 IMPLANT
GLOVE BIO SURGEON STRL SZ7.5 (GLOVE) ×2 IMPLANT
GLOVE INDICATOR 8.0 STRL GRN (GLOVE) ×2 IMPLANT
GOWN STRL REUS W/ TWL XL LVL3 (GOWN DISPOSABLE) ×3 IMPLANT
GRASPER SUT TROCAR 14GX15 (MISCELLANEOUS) IMPLANT
GRASPER TIP-UP FEN DVNC XI (INSTRUMENTS) ×1 IMPLANT
IRRIG SUCT STRYKERFLOW 2 WTIP (MISCELLANEOUS) IMPLANT
IRRIGATION SUCT STRKRFLW 2 WTP (MISCELLANEOUS) IMPLANT
KIT BASIN OR (CUSTOM PROCEDURE TRAY) ×1 IMPLANT
KIT TURNOVER KIT A (KITS) IMPLANT
MANIFOLD NEPTUNE II (INSTRUMENTS) ×1 IMPLANT
MARKER SKIN DUAL TIP RULER LAB (MISCELLANEOUS) ×1 IMPLANT
MESH SOFT 12X12IN BARD (Mesh General) IMPLANT
NDL SPNL 18GX3.5 QUINCKE PK (NEEDLE) ×1 IMPLANT
NEEDLE SPNL 18GX3.5 QUINCKE PK (NEEDLE) ×1 IMPLANT
OBTURATOR OPTICAL STND 8 DVNC (TROCAR) ×1 IMPLANT
OBTURATOR OPTICALSTD 8 DVNC (TROCAR) ×1 IMPLANT
PACK CARDIOVASCULAR III (CUSTOM PROCEDURE TRAY) ×1 IMPLANT
SCISSORS MNPLR CVD DVNC XI (INSTRUMENTS) ×1 IMPLANT
SEAL UNIV 5-12 XI (MISCELLANEOUS) ×3 IMPLANT
SET TUBE SMOKE EVAC HIGH FLOW (TUBING) ×1 IMPLANT
SOL ELECTROSURG ANTI STICK (MISCELLANEOUS) ×1 IMPLANT
SOLUTION ANTFG W/FOAM PAD STRL (MISCELLANEOUS) ×1 IMPLANT
SOLUTION ELECTROSURG ANTI STCK (MISCELLANEOUS) ×1 IMPLANT
SPIKE FLUID TRANSFER (MISCELLANEOUS) ×1 IMPLANT
SPONGE T-LAP 18X18 ~~LOC~~+RFID (SPONGE) ×1 IMPLANT
SUT MNCRL AB 4-0 PS2 18 (SUTURE) ×1 IMPLANT
SUT STRAFIX PDS 18 CTX (SUTURE) IMPLANT
SUT STRAFIX SYMMETRIC 0-0 24 (SUTURE) IMPLANT
SUT STRAFIX SYMMETRIC 1-0 12 (SUTURE) IMPLANT
SUT STRAFIX SYMMETRIC 1-0 24 (SUTURE) ×1 IMPLANT
SUT STRATA PDS 0 15 CT-1.5 (SUTURE) IMPLANT
SUT STRTFX SPIRAL PDS+ 2-0 23 (SUTURE) ×1 IMPLANT
SUT VLOC 180 0 9IN GS21 (SUTURE) IMPLANT
SUTURE STRAFIX SYMMETRC 0-0 24 (SUTURE) IMPLANT
SUTURE STRAFIX SYMMETRC 1-0 12 (SUTURE) IMPLANT
SUTURE STRAFIX SYMMETRC 1-0 24 (SUTURE) IMPLANT
SUTURE STRTFX SPRL PDS+ 2-0 23 (SUTURE) IMPLANT
SYR 20ML LL LF (SYRINGE) ×1 IMPLANT
TAPE STRIPS DRAPE STRL (GAUZE/BANDAGES/DRESSINGS) ×1 IMPLANT
TOWEL OR 17X26 10 PK STRL BLUE (TOWEL DISPOSABLE) ×1 IMPLANT
TROCAR ADV FIXATION 12X100MM (TROCAR) ×1 IMPLANT
TROCAR Z-THREAD FIOS 5X100MM (TROCAR) ×1 IMPLANT

## 2023-04-15 NOTE — Anesthesia Postprocedure Evaluation (Signed)
 Anesthesia Post Note  Patient: Lucas Buckley  Procedure(s) Performed: XI ROBOTIC INCISIONAL HERNIA REPAIR WITH MESH, BILATERAL POSTERIOR RECTUS MYOFASCIAL RELEASE     Patient location during evaluation: PACU Anesthesia Type: General Level of consciousness: awake and alert Pain management: pain level controlled Vital Signs Assessment: post-procedure vital signs reviewed and stable Respiratory status: spontaneous breathing, nonlabored ventilation, respiratory function stable and patient connected to nasal cannula oxygen Cardiovascular status: blood pressure returned to baseline and stable Postop Assessment: no apparent nausea or vomiting Anesthetic complications: no   No notable events documented.  Last Vitals:  Vitals:   04/15/23 1515 04/15/23 1530  BP: (!) 152/77 (!) 142/69  Pulse: (!) 59 (!) 46  Resp: 10 (!) 0  Temp: (!) 35.9 C   SpO2: 98% 99%    Last Pain:  Vitals:   04/15/23 1515  TempSrc:   PainSc: Asleep                 [name]

## 2023-04-15 NOTE — Anesthesia Procedure Notes (Signed)
 Procedure Name: Intubation Date/Time: 04/15/2023 12:51 PM  Performed by: Laurita Quint, CRNAPre-anesthesia Checklist: Patient identified, Emergency Drugs available, Suction available and Patient being monitored Patient Re-evaluated:Patient Re-evaluated prior to induction Oxygen Delivery Method: Circle System Utilized Preoxygenation: Pre-oxygenation with 100% oxygen Induction Type: IV induction Ventilation: Mask ventilation without difficulty Laryngoscope Size: Mac and 3 Grade View: Grade I Tube type: Oral Tube size: 7.5 mm Number of attempts: 1 Airway Equipment and Method: Stylet and Oral airway Placement Confirmation: ETT inserted through vocal cords under direct vision, positive ETCO2 and breath sounds checked- equal and bilateral Secured at: 22 cm Tube secured with: Tape Dental Injury: Teeth and Oropharynx as per pre-operative assessment

## 2023-04-15 NOTE — H&P (Signed)
 Admitting Physician: Lucas Buckley  Service: General Surgery  CC: Lucas Buckley  Subjective   HPI: Lucas Buckley is a 73 y.o. male who had a robotic prostatectomy on 03/29/2011 with Dr. Crecencio Mc. He has noticed a hernia at the prostate extraction site for many many years. It slowly gotten larger and larger. At this point is causing more symptoms and is rub against his close he would like repaired. The umbilical hernia repair in his 40s by Dr. Daphine Deutscher and does not know whether or not he has a hernia mesh in there.   Past Medical History:  Diagnosis Date   Arthritis    knee and sholders   Cancer (HCC)    basal cell/ left cheek/    PROSTATE   Coronary artery disease    ischemic heart disease-  stents x 2- 1999--LOV with clearance and note Dr Patty Sermons, EKG in [facility]   GERD (gastroesophageal reflux disease)    H/O hiatal hernia    Hypercholesterolemia    Hypertension    PONV (postoperative nausea and vomiting)    Renal stones     Past Surgical History:  Procedure Laterality Date   CARDIAC CATHETERIZATION     1999   CORONARY ANGIOPLASTY     FRACTURE SURGERY  1974   left ankle and lower leg   HERNIA REPAIR     umbilical   LITHOTRIPSY     ROBOT ASSISTED LAPAROSCOPIC RADICAL PROSTATECTOMY  03/29/2011   Procedure: ROBOTIC ASSISTED LAPAROSCOPIC RADICAL PROSTATECTOMY LEVEL 2;  Surgeon: Crecencio Mc, MD;  Location: [facility];  Service: Urology;  Laterality: N/A;  BILATERAL PELVIC LYMPHADENECTOMY       testicular     tortion repair in teens    Family History  Problem Relation Age of Onset   Heart failure Mother    Stroke Father     Social:  reports that he has never smoked. He has never used smokeless tobacco. He reports that he does not drink alcohol and does not use drugs.  Allergies: No Known Allergies  Medications: Current Outpatient Medications  Medication Instructions   alum hydroxide-mag trisilicate (GAVISCON) 80-20 MG CHEW chewable tablet 2 tablets, At bedtime PRN    aspirin EC 162 mg, Daily   chlorthalidone (HYGROTON) 25 MG tablet TAKE 1 TABLET BY MOUTH EVERY DAY   metoprolol succinate (TOPROL-XL) 100 mg, Oral, Daily   Multiple Vitamin (MULTIVITAMIN WITH MINERALS) TABS tablet 1 tablet, Daily   nitroGLYCERIN (NITROSTAT) 0.4 mg, Sublingual, Every 5 min PRN   rosuvastatin (CRESTOR) 40 MG tablet TAKE 1 TABLET(40 MG) BY MOUTH DAILY   valsartan (DIOVAN) 160 MG tablet TAKE 1 TABLET(160 MG) BY MOUTH DAILY    ROS - all of the below systems have been reviewed with the patient and positives are indicated with bold text General: chills, fever or night sweats Eyes: blurry vision or double vision ENT: epistaxis or sore throat Allergy/Immunology: itchy/watery eyes or nasal congestion Hematologic/Lymphatic: bleeding problems, blood clots or swollen lymph nodes Endocrine: temperature intolerance or unexpected weight changes Breast: new or changing breast lumps or nipple discharge Resp: cough, shortness of breath, or wheezing CV: chest pain or dyspnea on exertion GI: as per HPI GU: dysuria, trouble voiding, or hematuria MSK: joint pain or joint stiffness Neuro: TIA or stroke symptoms Derm: pruritus and skin lesion changes Psych: anxiety and depression  Objective   PE Blood pressure (!) 157/72, pulse (!) 52, temperature 98.1 F (36.7 C), temperature source Oral, resp. rate 18, height 6\' 1"  (1.854 m),  weight 108.9 kg, SpO2 97%. Constitutional: NAD; conversant; no deformities Eyes: Moist conjunctiva; no lid lag; anicteric; PERRL Neck: Trachea midline; no thyromegaly Lungs: Normal respiratory effort; no tactile fremitus CV: RRR; no palpable thrills; no pitting edema GI: Abd midline Incisional hernia peri/supraumbilical; no palpable hepatosplenomegaly MSK: Normal range of motion of extremities; no clubbing/cyanosis Psychiatric: Appropriate affect; alert and oriented x3 Lymphatic: No palpable cervical or axillary lymphadenopathy  Results for orders placed or  performed during the hospital encounter of 04/15/23 (from the past 24 hours)  Glucose, capillary     Status: Abnormal   Collection Time: 04/15/23 11:54 AM  Result Value Ref Range   Glucose-Capillary 119 (H) 70 - 99 mg/dL   Comment 1 Notify RN     Imaging Orders  No imaging studies ordered today     Assessment and Plan   Lucas Buckley has a 6 cm x 2 cm supraumbilical incisional hernia. I recommended robotic incisional hernia repair with bilateral posterior rectus myofascial release, possible bilateral transversus abdominis myofascial release, and possible removal of intraperitoneal mesh. We discussed the procedure itself as well as its risk, benefits, and alternatives. After full discussion all questions answered patient granted consent to proceed.     Quentin Ore, MD  [facility] Use [email or web address] to contact on call provider

## 2023-04-15 NOTE — Discharge Instructions (Signed)

## 2023-04-15 NOTE — Op Note (Signed)
 Patient: Lucas Buckley (1950-07-01, [id number])  Date of Surgery: 04/15/2023  Preoperative Diagnosis: INCISIONAL HERNIA (6cm x 2cm on preoperative exam)  Postoperative Diagnosis: INCISIONAL HERNIA (6cm x 2cm on preoperative exam)  Surgical Procedure:  XI ROBOTIC INCISIONAL HERNIA REPAIR WITH MESH, BILATERAL POSTERIOR RECTUS MYOFASCIAL RELEASE:    Operative Team Members:  Surgeons and Role:    * Dontrelle Mazon, Hyman Hopes, MD - Primary   Anesthesiologist: Shelton Silvas, MD; Bethena Midget, MD CRNA: Vanessa , CRNA; Laurita Quint, CRNA   Anesthesia: General   Fluids:  Total I/O In: 100 [IV Piggyback:100] Out: -   Complications: None  Drains:  None  Specimen: None  Disposition:  PACU - hemodynamically stable.  Plan of Care: Admit for overnight observation  Indications for Procedure: Lucas Buckley is a 73 y.o. male who presented with an incisional hernia after prostatectomy.  He had a has a 6 cm x 2 cm supraumbilical incisional hernia. I recommended robotic incisional hernia repair with bilateral posterior rectus myofascial release, possible bilateral transversus abdominis myofascial release, and possible removal of intraperitoneal mesh.   The procedure itself as well as the risks, benefits and alternatives were described.  The risks discussed included but were not limited to the risk of infection, bleeding, damage to nearby structures, recurrent hernia, chronic pain, and mesh complication requiring removal.  After a full discussion and all questions answered, the patient granted consent to proceed.  Findings:  Hernia Location: Ventral hernia location: Epigastric (M2) and Umbilical (M3) Hernia Size:  2cm wide x 6 cm tall  Mesh Size &Type:  41 cm tall x 26 cm wide Bard Soft Mesh Mesh Position: Sublay - Retromuscular Myofascial Releases: Bilateral posterior rectus myofascial release   Description of Procedure: The patient was positioned supine, moderately flexed  at the umbilical level, padded and secured on the operating table.  A timeout procedure was performed.    What is described is a robotic, totally extraperitoneal retromuscular incisional hernia repair with bilateral rectus myofascial release and retromuscular mesh placement.  Laparoscopic Portion: The retrorectus space was entered in the LEFT hypochondrium, at approximately the midclavicular line utilizing a 5 mm optical-viewing trocar.  Upon safe entry into this space, it was insufflated while performing a blunt dissection with the camera still in the optical trocar. A rectus myofascial release was performed on the LEFT side. Dissection was carried out laterally in the retromuscular plane to the edge of the rectus sheath progressively disconnecting the rectus muscle from the underlying posterior rectus sheath. Both the segmental innervation as well as the intercostal artery and vein brances to the rectus muscle were individually preserved.    During the left sided retrorectus dissection, a 12 mm trocar was placed into the lateral most edge of the retrorectus space.  With these initial trocars in position, the medial most aspect of the retrorectus plane was identified, and the posterior sheath was visualized as it inserted on the linea alba. The posterior sheath was incised with cautery entering the preperitoneal plane. A crossover was performed dissecting under the linea alba in the preperitoneal plane until the right rectus sheath was identified.  After identification of the right rectus sheath, it was incised vertically to enter the retrorectus space on the right. A rectus myofascial release was performed on the RIGHT side.  Blunt dissection was carried out laterally in the retromuscular plane to the edge of the rectus sheath progressively disconnecting the rectus muscle from the underlying posterior rectus sheath. Both the segmental  innervation as well as the intercostal artery and vein brances to the  rectus muscle were individually preserved.   At this juncture, both retrorectus planes were initially connected to each other and there was space for further trocar placement. An 8 mm robotic trocar was placed in the midclavicular line in right retrorectus space.  A 8mm robotic trocar was placed within the left rectus musculature in the upper abdomen, and not through the linea alba.  The initial 5 mm access trocar in the midclavicular line within the left retrorectus space was switched out for an 8 mm robotic trocar.   Robotic Portion: The Intuitive daVinci Xi surgical robot was docked in the standard fashion and the procedure begun from the robotic console. A Prograsp instrument and monopolar shears were used for the dissection.  Dissection was carried down inferiorly preserving the peritoneum and the preperitoneal fat in the midline as it was gently dissected off of the overlying linea alba.  On the right side, the posterior rectus sheath was progressively disconnected from its insertion on the linea alba. This allowed for progression of the right side rectus myofascial release.  The rectus myofascial release accomplished medialization of the posterior rectus sheath towards the midline and disinsertion of the rectus muscle from its surrounding fascia, and thus its encasement in the rectus sheath, allowing for widening of the rectus muscle and transfer of the rectus flap towards the midline.  This will allow for future inset of the medial aspect of the flap for abdominal wall reconstruction.  Similarly, on the left side, the posterior rectus sheath was also progressively disconnected from its insertion on the linea alba.  This allowed for progression of the left side rectus myofascial release.  The rectus myofascial release accomplished medialization of the posterior rectus sheath towards the midline and disinsertion of the rectus muscle from its surrounding fascia, and thus its encasement in the rectus  sheath, allowing for widening of the rectus muscle and transfer of the rectus flap towards the midline.  This will allow for future inset of the medial aspect of the flap for abdominal wall reconstruction.  During the dissection of the midline the hernia defect was identified and the hernia sac was not reducible, therefore the hernia peritoneum was incised circumferentially around the edge of the hernia defect which left a defect within the peritoneum in the midline.  This defect was later closed with a running 2-0 Ethicon Stratafix Spiral PDS suture.  Both the left and the right rectus myofascial releases were performed towards the lower abdomen, past the arcuate line bilaterally.  During this dissection, the peritoneum and preperitoneal fat in the midline were further preserved below the hernia as they were dissected off of the overlying linea alba.   The hernia defect area was now visualized fully.  The hernia defects were located in the epigastric, umbilical.    The hernia defect was closed utilizing a continuous, #1 Ethicon Stratafix Symmetric PDS Plus suture.  The hernia defect, and subsequently the rectus musculature, came together well for a complete abdominal wall reconstruction.  The dissected out retrorectus space was measured with a metric ruler so as to determine the size of the proposed mesh.    The robot was undocked and the laparoscope was inserted, inspecting for hemostasis.  The mesh deployment was performed laparoscopically.  Laparoscopic Portion:  A transversus abdominis plane (TAP) block was performed bilaterally with a mixture of marcaine and Exparel.  The anesthetic was first injected into the plane between the  transversus abdominis and internal abdominal oblique muscles on the left. The TAP was repeated on the contralateral side.   A piece of Bard Soft was opened and trimmed to 26 cm wide x 41 cm tall. The mesh was advanced into the retrorectus space and the mesh positioned  flat against the intact posterior rectus sheaths. The mesh was not fixated as it occupied the entire retromuscular plane, and also covered all of the trocars.  The trocars were removed and the skin closed with 4-0 Monocryl subcuticular sutures and skin glue.   Ivar Drape, MD General, Bariatric, & Minimally Invasive Surgery [facility], [place]

## 2023-04-15 NOTE — Transfer of Care (Signed)
 Immediate Anesthesia Transfer of Care Note  Patient: Lucas Buckley  Procedure(s) Performed: XI ROBOTIC INCISIONAL HERNIA REPAIR WITH MESH, BILATERAL POSTERIOR RECTUS MYOFASCIAL RELEASE  Patient Location: PACU  Anesthesia Type:General  Level of Consciousness: awake and alert   Airway & Oxygen Therapy: Patient Spontanous Breathing and Patient connected to face mask oxygen  Post-op Assessment: Report given to RN and Post -op Vital signs reviewed and stable  Post vital signs: Reviewed and stable  Last Vitals:  Vitals Value Taken Time  BP 152/77 04/15/23 1516  Temp    Pulse 57 04/15/23 1517  Resp 11 04/15/23 1517  SpO2 98 % 04/15/23 1517  Vitals shown include unfiled device data.  Last Pain:  Vitals:   04/15/23 1207  TempSrc:   PainSc: 0-No pain         Complications: No notable events documented.

## 2023-04-16 DIAGNOSIS — K432 Incisional hernia without obstruction or gangrene: Secondary | ICD-10-CM | POA: Diagnosis not present

## 2023-04-16 NOTE — Discharge Summary (Signed)
 Physician Discharge Summary  Patient ID: Lucas Buckley MRN: [id number] DOB/AGE: [birth year] 74 y.o.  Admit date: 04/15/2023 Discharge date: 04/16/2023  Admission Diagnoses: Ventral hernia  Discharge Diagnoses:  Principal Problem:   Ventral hernia   Discharged Condition: good  Hospital Course: Patient was admitted as an outpatient with extended observation following uneventful robotic Tapp repair of ventral hernia.  On postop day 1, the patient reported he was having no or minimal pain, tolerating a diet without nausea or bloating, and endorsed flatus but no bowel movement yet.  He is ambulating independently.   Discharge Exam: Blood pressure 125/65, pulse (!) 52, temperature 97.6 F (36.4 C), temperature source Oral, resp. rate 16, height 6\' 1"  (1.854 m), weight 108.9 kg, SpO2 94%. Alert, pleasant, well-appearing Unlabored respirations Abdomen is soft, nondistended, minimally appropriately tender.  Incisions are all clean, dry intact with Dermabond. Extremities warm well-perfused, no edema  Disposition: Discharge disposition: 01-Home or Self Care       Discharge Instructions     Diet - low sodium heart healthy   Complete by: As directed    Increase activity slowly   Complete by: As directed       Allergies as of 04/16/2023   No Known Allergies      Medication List     TAKE these medications    alum hydroxide-mag trisilicate 80-20 MG Chew chewable tablet Commonly known as: GAVISCON Chew 2 tablets by mouth at bedtime as needed for indigestion or heartburn.   aspirin EC 81 MG tablet Take 162 mg by mouth daily.   chlorthalidone 25 MG tablet Commonly known as: HYGROTON TAKE 1 TABLET BY MOUTH EVERY DAY What changed: additional instructions   metoprolol succinate 100 MG 24 hr tablet Commonly known as: TOPROL-XL Take 1 tablet (100 mg total) by mouth daily.   multivitamin with minerals Tabs tablet Take 1 tablet by mouth daily.   nitroGLYCERIN 0.4 MG SL  tablet Commonly known as: NITROSTAT Place 1 tablet (0.4 mg total) under the tongue every 5 (five) minutes as needed for chest pain.   oxyCODONE-acetaminophen 5-325 MG tablet Commonly known as: Percocet Take 1 tablet by mouth every 4 (four) hours as needed for severe pain (pain score 7-10).   rosuvastatin 40 MG tablet Commonly known as: CRESTOR TAKE 1 TABLET(40 MG) BY MOUTH DAILY What changed: See the new instructions.   valsartan 160 MG tablet Commonly known as: DIOVAN TAKE 1 TABLET(160 MG) BY MOUTH DAILY What changed: See the new instructions.        Follow-up Information     Stechschulte, Hyman Hopes, MD Follow up in 4 week(s).   Specialty: Surgery Contact information: [address] [place] [address] [phone]                 Signed: Berna Bue 04/16/2023, 8:39 AM

## 2023-04-16 NOTE — Progress Notes (Signed)
 Mobility Specialist - Progress Note   04/16/23 0900  Mobility  Activity Ambulated with assistance in hallway  Level of Assistance Standby assist, set-up cues, supervision of patient - no hands on  Assistive Device  (HHA)  Distance Ambulated (ft) 500 ft  Activity Response Tolerated well  Mobility Referral Yes  Mobility visit 1 Mobility  Mobility Specialist Start Time (ACUTE ONLY) 0835  Mobility Specialist Stop Time (ACUTE ONLY) 0842  Mobility Specialist Time Calculation (min) (ACUTE ONLY) 7 min   Pt received in bed and agreeable to mobility. No complaints during session. Pt to bed after session with all needs met.    [facility]

## 2023-04-16 NOTE — Progress Notes (Signed)
 Assessment unchanged. Pt and wife verbalized understanding of dc instructions including medications to resume, follow up care and when to call the doctor. Discharged via wc to front entrance accompanied by NT.

## 2023-04-16 NOTE — Plan of Care (Signed)
  Problem: Activity: Goal: Risk for activity intolerance will decrease Outcome: Completed/Met   Problem: Nutrition: Goal: Adequate nutrition will be maintained Outcome: Completed/Met   Problem: Elimination: Goal: Will not experience complications related to urinary retention Outcome: Completed/Met

## 2023-04-16 NOTE — TOC Initial Note (Signed)
 Transition of Care [facility]) - Initial/Assessment Note    Patient Details  Name: Lucas Buckley MRN: [id number] Date of Birth: 1950-08-07  Transition of [facility] CM/SW Contact:    Adrian Prows, RN Phone Number: 04/16/2023, 9:25 AM  Clinical Narrative:                 Sherron Monday w/ pt in room; pt says he lives at  home with his spouse Burel Kahre [phone]); he plans to return at d/c; pt says his wife will provide transportation; he verified insurance/PCP; he denies SDOH risks; pt says he does not have DME, HH services, or home oxygen; no TOC needs.  Expected Discharge Plan: Home/Self Care Barriers to Discharge: No Barriers Identified   Patient Goals and CMS Choice Patient states their goals for this hospitalization and ongoing recovery are:: Home          Expected Discharge Plan and Services   Discharge Planning Services: CM Consult Post Acute Care Choice: NA Living arrangements for the past 2 months: Single Family Home Expected Discharge Date: 04/16/23               DME Arranged: N/A DME Agency: NA       HH Arranged: NA HH Agency: NA        Prior Living Arrangements/Services Living arrangements for the past 2 months: Single Family Home Lives with:: Spouse Patient language and need for interpreter reviewed:: Yes Do you feel safe going back to the place where you live?: Yes      Need for Family Participation in Patient Care: Yes (Comment) Care giver support system in place?: Yes (comment) Current home services:  (n/a) Criminal Activity/Legal Involvement Pertinent to Current Situation/Hospitalization: No - Comment as needed  Activities of Daily Living   ADL Screening (condition at time of admission) Independently performs ADLs?: Yes (appropriate for developmental age) Is the patient deaf or have difficulty hearing?: No Does the patient have difficulty seeing, even when wearing glasses/contacts?: No Does the patient have difficulty concentrating,  remembering, or making decisions?: No  Permission Sought/Granted Permission sought to share information with : Case Manager Permission granted to share information with : Yes, Verbal Permission Granted  Share Information with NAME: Case Manager     Permission granted to share info w Relationship: Orlo Brickle (spouse) [phone]     Emotional Assessment Appearance:: Appears stated age Attitude/Demeanor/Rapport: Gracious Affect (typically observed): Accepting Orientation: : Oriented to Self, Oriented to Place, Oriented to  Time, Oriented to Situation Alcohol / Substance Use: Not Applicable Psych Involvement: No (comment)  Admission diagnosis:  Ventral hernia [K43.9] Patient Active Problem List   Diagnosis Date Noted   Ventral hernia 04/15/2023   Essential hypertension 04/11/2018   Hypertrophic cardiomyopathy (HCC) 04/02/2016   H/O prostate cancer 07/27/2011   Hypercholesterolemia 01/27/2011   Ischemic heart disease 01/27/2011   IHSS (idiopathic hypertrophic subaortic stenosis) (HCC) 01/27/2011   PCP:  Elias Else, MD Pharmacy:   [facility] [phone] [place], [place] - [address] AT [facility] OF [address] [place] [address] Phone: [phone] Fax: [phone]  [id number] - [place], [place] - [address] [place] [address] Phone: [phone] Fax: [phone]     Social Drivers of Health (SDOH) Social History: SDOH Screenings   Food Insecurity: No Food Insecurity (04/16/2023)  Housing: Low Risk  (04/16/2023)  Transportation Needs: No Transportation Needs (04/16/2023)  Utilities: Not At Risk (04/16/2023)  Social Connections:  Socially Integrated (04/15/2023)  Tobacco Use: Low Risk  (04/15/2023)   SDOH Interventions: Food Insecurity Interventions: Intervention Not Indicated, Inpatient TOC Housing Interventions: Intervention Not Indicated, Inpatient TOC Transportation Interventions:  Intervention Not Indicated, Inpatient TOC Utilities Interventions: Intervention Not Indicated, Inpatient TOC   Readmission Risk Interventions     No data to display

## 2023-04-16 NOTE — Plan of Care (Signed)
  Problem: Education: Goal: Knowledge of General Education information will improve Description: Including pain rating scale, medication(s)/side effects and non-pharmacologic comfort measures Outcome: Adequate for Discharge   Problem: Health Behavior/Discharge Planning: Goal: Ability to manage health-related needs will improve Outcome: Adequate for Discharge   Problem: Clinical Measurements: Goal: Ability to maintain clinical measurements within normal limits will improve Outcome: Adequate for Discharge Goal: Will remain free from infection Outcome: Adequate for Discharge Goal: Diagnostic test results will improve Outcome: Adequate for Discharge Goal: Respiratory complications will improve Outcome: Adequate for Discharge Goal: Cardiovascular complication will be avoided Outcome: Adequate for Discharge   Problem: Coping: Goal: Level of anxiety will decrease Outcome: Adequate for Discharge   Problem: Elimination: Goal: Will not experience complications related to bowel motility Outcome: Adequate for Discharge   Problem: Pain Managment: Goal: General experience of comfort will improve and/or be controlled Outcome: Adequate for Discharge   Problem: Safety: Goal: Ability to remain free from injury will improve Outcome: Adequate for Discharge   Problem: Skin Integrity: Goal: Risk for impaired skin integrity will decrease Outcome: Adequate for Discharge   Problem: Education: Goal: Knowledge of General Education information will improve Description: Including pain rating scale, medication(s)/side effects and non-pharmacologic comfort measures Outcome: Adequate for Discharge   Problem: Health Behavior/Discharge Planning: Goal: Ability to manage health-related needs will improve Outcome: Adequate for Discharge   Problem: Clinical Measurements: Goal: Ability to maintain clinical measurements within normal limits will improve Outcome: Adequate for Discharge Goal: Will remain  free from infection Outcome: Adequate for Discharge Goal: Diagnostic test results will improve Outcome: Adequate for Discharge Goal: Respiratory complications will improve Outcome: Adequate for Discharge Goal: Cardiovascular complication will be avoided Outcome: Adequate for Discharge   Problem: Activity: Goal: Risk for activity intolerance will decrease Outcome: Adequate for Discharge   Problem: Nutrition: Goal: Adequate nutrition will be maintained Outcome: Adequate for Discharge   Problem: Coping: Goal: Level of anxiety will decrease Outcome: Adequate for Discharge   Problem: Elimination: Goal: Will not experience complications related to bowel motility Outcome: Adequate for Discharge Goal: Will not experience complications related to urinary retention Outcome: Adequate for Discharge   Problem: Pain Managment: Goal: General experience of comfort will improve and/or be controlled Outcome: Adequate for Discharge   Problem: Safety: Goal: Ability to remain free from injury will improve Outcome: Adequate for Discharge   Problem: Skin Integrity: Goal: Risk for impaired skin integrity will decrease Outcome: Adequate for Discharge

## 2023-04-18 ENCOUNTER — Encounter (HOSPITAL_COMMUNITY): Payer: Self-pay | Admitting: Surgery

## 2023-05-11 ENCOUNTER — Other Ambulatory Visit: Payer: Self-pay | Admitting: Cardiology

## 2023-05-12 ENCOUNTER — Other Ambulatory Visit: Payer: Self-pay | Admitting: Cardiology

## 2023-05-16 ENCOUNTER — Other Ambulatory Visit: Payer: Self-pay | Admitting: Cardiology

## 2023-05-18 ENCOUNTER — Telehealth: Payer: Self-pay | Admitting: Cardiology

## 2023-05-18 MED ORDER — METOPROLOL SUCCINATE ER 100 MG PO TB24: 100.0000 mg | ORAL_TABLET | Freq: Every day | ORAL | 0 refills | Status: DC

## 2023-05-18 NOTE — Telephone Encounter (Signed)
 Pt's medication was sent to pt's pharmacy as requested. Confirmation received.

## 2023-05-18 NOTE — Telephone Encounter (Signed)
*  STAT* If patient is at the pharmacy, call can be transferred to refill team.   1. Which medications need to be refilled? (please list name of each medication and dose if known) metoprolol succinate (TOPROL-XL) 100 MG 24 hr tablet   2. Which pharmacy/location (including street and city if local pharmacy) is medication to be sent to? [id number] - [place], [place] - [address]   3. Do they need a 30 day or 90 day supply? 90  Patient has appt on 5/13

## 2023-05-27 ENCOUNTER — Other Ambulatory Visit: Payer: Self-pay | Admitting: Cardiology

## 2023-05-27 DIAGNOSIS — I10 Essential (primary) hypertension: Secondary | ICD-10-CM

## 2023-06-10 ENCOUNTER — Other Ambulatory Visit: Payer: Self-pay | Admitting: Cardiology

## 2023-07-02 NOTE — Progress Notes (Unsigned)
 Cardiology Office Note   Date:  07/05/2023   ID:  Lucas Buckley, DOB 1950-04-02, MRN [id number]  PCP:  Vidal Graven, MD  Cardiologist: Chaselyn Nanney [place] MD  Chief Complaint  Patient presents with   Coronary Artery Disease      History of Present Illness: Lucas Buckley is a 73 y.o. male who presents for follow up of HCM and CAD.    The patient has a history of known ischemic heart disease.  In 1999 he had stents to his right coronary artery and to his circumflex. The patient has a history of HOCM.  He also has a history of hypercholesterolemia and borderline glucose intolerance and exogenous obesity.  He had a stress Myoview  study in February 2018 showing a small fixed apical lateral defect. No ischemia. EF 44%. Echo was done showing severe concentric LVH. No outflow gradient. Mild AS. EF was normal.   He did undergo surgical repair of incisional hernia in March 2025.   On follow up today he reports he is doing very well from a cardiac standpoint. He denies any chest pain or SOB. No edema or palpitations. Weight is stable. 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 Does note some fatigue and cold intolerance.   Past Medical History:  Diagnosis Date   Arthritis    knee and sholders   Cancer (HCC)    basal cell/ left cheek/    PROSTATE   Coronary artery disease    ischemic heart disease-  stents x 2- 1999--LOV with clearance and note Dr Santiago Cuff, EKG in [facility]   GERD (gastroesophageal reflux disease)    H/O hiatal hernia    Hypercholesterolemia    Hypertension    PONV (postoperative nausea and vomiting)    Renal stones     Past Surgical History:  Procedure Laterality Date   CARDIAC CATHETERIZATION     1999   CORONARY ANGIOPLASTY     FRACTURE SURGERY  1974   left ankle and lower leg   HERNIA REPAIR     umbilical   LITHOTRIPSY     ROBOT ASSISTED LAPAROSCOPIC RADICAL PROSTATECTOMY  03/29/2011   Procedure: ROBOTIC ASSISTED LAPAROSCOPIC RADICAL PROSTATECTOMY LEVEL 2;   Surgeon: Kristeen Peto, MD;  Location: [facility];  Service: Urology;  Laterality: N/A;  BILATERAL PELVIC LYMPHADENECTOMY       testicular     tortion repair in teens   XI ROBOTIC ASSISTED VENTRAL HERNIA N/A 04/15/2023   Procedure: XI ROBOTIC INCISIONAL HERNIA REPAIR WITH MESH, BILATERAL POSTERIOR RECTUS MYOFASCIAL RELEASE;  Surgeon: Junie Olds, MD;  Location: [facility];  Service: General;  Laterality: N/A;     Current Outpatient Medications  Medication Sig Dispense Refill   alum hydroxide-mag trisilicate (GAVISCON) 80-20 MG CHEW chewable tablet Chew 2 tablets by mouth at bedtime as needed for indigestion or heartburn.     aspirin  EC 81 MG tablet Take 162 mg by mouth daily.     chlorthalidone  (HYGROTON ) 25 MG tablet TAKE 1 TABLET BY MOUTH EVERY DAY (Patient taking differently: Take 25 mg by mouth daily. TAKE 1 TABLET BY MOUTH EVERY DAY.) 90 tablet 3   metoprolol  succinate (TOPROL -XL) 100 MG 24 hr tablet Take 1 tablet (100 mg total) by mouth daily. PLEASE KEEP UPCOMING APPOINTMENT IN ORDER TO RECEIVE FUTURE REFILLS. THANK YOU! 30 tablet 1   Multiple Vitamin (MULTIVITAMIN WITH MINERALS) TABS tablet Take 1 tablet by mouth daily.     nitroGLYCERIN  (NITROSTAT ) 0.4 MG SL tablet Place 1 tablet (0.4  mg total) under the tongue every 5 (five) minutes as needed for chest pain. 25 tablet prn   rosuvastatin  (CRESTOR ) 40 MG tablet TAKE 1 TABLET(40 MG) BY MOUTH DAILY 90 tablet 0   valsartan  (DIOVAN ) 160 MG tablet TAKE 1 TABLET(160 MG) BY MOUTH DAILY 90 tablet 0   No current facility-administered medications for this visit.    Allergies:   Patient has no known allergies.    Social History:  The patient  reports that he has never smoked. He has never used smokeless tobacco. He reports that he does not drink alcohol and does not use drugs.   Family History:  The patient's family history includes Heart failure in his mother; Stroke in his father.    ROS:  Please see the history of present illness.    Otherwise, review of systems are positive for none.   All other systems are reviewed and negative.    PHYSICAL EXAM: VS:  BP 120/60 (BP Location: Left Arm, Patient Position: Sitting, Cuff Size: Normal)   Pulse 73   Ht 6\' 1"  (1.854 m)   Wt 244 lb (110.7 kg)   SpO2 100%   BMI 32.19 kg/m  , BMI Body mass index is 32.19 kg/m. GENERAL:  Well appearing obese WM in NAD HEENT:  PERRL, EOMI, sclera are clear. Oropharynx is clear. NECK:  No jugular venous distention, carotid upstroke brisk and symmetric, no bruits, no thyromegaly or adenopathy LUNGS:  Clear to auscultation bilaterally CHEST:  Unremarkable HEART:  RRR,  PMI not displaced or sustained,S1 and S2 within normal limits, no S3, no S4: no clicks, no rubs, 2/6 SEM across precordium.  ABD:  Soft, nontender. BS +, no masses or bruits. No hepatomegaly, no splenomegaly EXT:  2 + pulses throughout, no edema, no cyanosis no clubbing SKIN:  Warm and dry.  No rashes NEURO:  Alert and oriented x 3. Cranial nerves II through XII intact. PSYCH:  Cognitively intact      Lab Results  Component Value Date   WBC 6.7 04/15/2023   HGB 13.4 04/15/2023   HCT 40.5 04/15/2023   PLT 100 (L) 04/15/2023   GLUCOSE 133 (H) 04/05/2023   CHOL 127 07/23/2022   TRIG 59 07/23/2022   HDL 68 07/23/2022   LDLCALC 46 07/23/2022   ALT 36 07/23/2022   AST 47 (H) 07/23/2022   NA 140 04/05/2023   K 4.5 04/05/2023   CL 104 04/05/2023   CREATININE 1.35 (H) 04/15/2023   BUN 22 04/05/2023   CO2 26 04/05/2023   TSH 6.210 (H) 03/31/2018   HGBA1C 6.7 (H) 04/05/2023   Dated 04/17/21: A1c 7.8%. creatinine 1.39. BUN 32. Glucose 127, cholesterol 138, trig 151, HDL 40, LDL 72.  Wt Readings from Last 3 Encounters:  07/05/23 244 lb (110.7 kg)  04/15/23 240 lb (108.9 kg)  04/05/23 240 lb (108.9 kg)     Echo 04/20/16: Study Conclusions   - Left ventricle: The cavity size was normal. There was severe   concentric hypertrophy. Systolic function was normal. The    estimated ejection fraction was in the range of 55% to 60%. Wall   motion was normal; there were no regional wall motion   abnormalities. Doppler parameters are consistent with a   reversible restrictive pattern, indicative of decreased left   ventricular diastolic compliance and/or increased left atrial   pressure (grade 3 diastolic dysfunction). Doppler parameters are   consistent with high ventricular filling pressure. - Aortic valve: Valve mobility was restricted. There was  mild   stenosis. There was no regurgitation. Peak velocity (S): 265   cm/s. Mean gradient (S): 14 mm Hg. Peak gradient (S): 28 mm Hg. - Mitral valve: Transvalvular velocity was within the normal range.   There was no evidence for stenosis. There was trivial   regurgitation. - Right ventricle: The cavity size was normal. Wall thickness was   normal. Systolic function was normal. - Atrial septum: No defect or patent foramen ovale was identified   by color flow Doppler. - Tricuspid valve: There was no regurgitation.   Myoview  04/14/16: Study Highlights     The left ventricular ejection fraction is moderately decreased (30-44%). Nuclear stress EF: 44%. There was no ST segment deviation noted during stress. Blood pressure demonstrated a hypertensive response to exercise. This is an intermediate risk study.   1. EF 44%, diffuse hypokinesis.  2. Fixed, small, mild apical lateral perfusion defect.  Possible attenuation, but cannot rule out small area of infarction.  No ischemia.  3. Intermediate risk study due to low EF.      ASSESSMENT AND PLAN:  1.  HCM. Most recent Echo in 2018 showed severe concentric LVH. No outflow gradient. He is asymptomatic.  Will continue Toprol . I would like to update his Echo now with HCM and Aortic stenosis.   2.  CAD status post stents to right coronary artery and circumflex in 1999. Myoview  in February 2018 showed no ischemia.  No active angina. Continue ASA  3.   Hypercholesterolemia. On statin. Will check lab work today  4.  Obesity, encourage weight loss   5.  History of prostate cancer treated with robotic surgery with Dr. Rozanne Corners  6.  HTN  BP controlled on valsartan , chlorthalidone , and Toprol   7. Glucose intolerance/DM.  8. CKD stage 3a.   Current medicines are reviewed at length with the patient today.  The patient does not have concerns regarding medicines.  The following changes have been made: see above.  Labs/ tests ordered today include:    CMET, Lipids, TSH  Echo   Disposition: follow up in 6 months  Signed, Lucas Heather [place] MD, [facility]   07/05/2023 3:40 PM    [facility]

## 2023-07-05 ENCOUNTER — Ambulatory Visit: Attending: Cardiology | Admitting: Cardiology

## 2023-07-05 ENCOUNTER — Encounter: Payer: Self-pay | Admitting: Cardiology

## 2023-07-05 VITALS — BP 120/60 | HR 73 | Ht 73.0 in | Wt 244.0 lb

## 2023-07-05 DIAGNOSIS — I350 Nonrheumatic aortic (valve) stenosis: Secondary | ICD-10-CM | POA: Diagnosis not present

## 2023-07-05 DIAGNOSIS — I422 Other hypertrophic cardiomyopathy: Secondary | ICD-10-CM

## 2023-07-05 DIAGNOSIS — E7800 Pure hypercholesterolemia, unspecified: Secondary | ICD-10-CM

## 2023-07-05 DIAGNOSIS — I2510 Atherosclerotic heart disease of native coronary artery without angina pectoris: Secondary | ICD-10-CM | POA: Diagnosis not present

## 2023-07-05 NOTE — Patient Instructions (Signed)
 Medication Instructions:  Continue same medications *If you need a refill on your cardiac medications before your next appointment, please call your pharmacy*  Lab Work: Cmet,lipid panel,tsh today  Testing/Procedures: Echo  first available  Follow-Up: At [facility], you and your health needs are our priority.  As part of our continuing mission to provide you with exceptional heart care, our providers are all part of one team.  This team includes your primary Cardiologist (physician) and Advanced Practice Providers or APPs (Physician Assistants and Nurse Practitioners) who all work together to provide you with the care you need, when you need it.  Your next appointment:  6 months   Call in July to schedule Nov appointment     Provider:  Dr.Jordan    We recommend signing up for the patient portal called "MyChart".  Sign up information is provided on this After Visit Summary.  MyChart is used to connect with patients for Virtual Visits (Telemedicine).  Patients are able to view lab/test results, encounter notes, upcoming appointments, etc.  Non-urgent messages can be sent to your provider as well.   To learn more about what you can do with MyChart, go to [email or web address].

## 2023-07-06 ENCOUNTER — Ambulatory Visit: Payer: Self-pay | Admitting: Cardiology

## 2023-07-06 LAB — COMPREHENSIVE METABOLIC PANEL WITH GFR
ALT: 26 IU/L (ref 0–44)
AST: 42 IU/L — ABNORMAL HIGH (ref 0–40)
Albumin: 4.2 g/dL (ref 3.8–4.8)
Alkaline Phosphatase: 67 IU/L (ref 44–121)
BUN/Creatinine Ratio: 19 (ref 10–24)
BUN: 23 mg/dL (ref 8–27)
Bilirubin Total: 0.8 mg/dL (ref 0.0–1.2)
CO2: 22 mmol/L (ref 20–29)
Calcium: 9.6 mg/dL (ref 8.6–10.2)
Chloride: 104 mmol/L (ref 96–106)
Creatinine, Ser: 1.23 mg/dL (ref 0.76–1.27)
Globulin, Total: 2.4 g/dL (ref 1.5–4.5)
Glucose: 118 mg/dL — ABNORMAL HIGH (ref 70–99)
Potassium: 4.1 mmol/L (ref 3.5–5.2)
Sodium: 140 mmol/L (ref 134–144)
Total Protein: 6.6 g/dL (ref 6.0–8.5)
eGFR: 62 mL/min/{1.73_m2} (ref >59)

## 2023-07-06 LAB — LIPID PANEL
Chol/HDL Ratio: 3.3 ratio (ref 0.0–5.0)
Cholesterol, Total: 155 mg/dL (ref 100–199)
HDL: 47 mg/dL (ref >39)
LDL Chol Calc (NIH): 82 mg/dL (ref 0–99)
Triglycerides: 150 mg/dL — ABNORMAL HIGH (ref 0–149)
VLDL Cholesterol Cal: 26 mg/dL (ref 5–40)

## 2023-07-06 LAB — TSH: TSH: 4.01 u[IU]/mL (ref 0.450–4.500)

## 2023-07-14 DIAGNOSIS — L820 Inflamed seborrheic keratosis: Secondary | ICD-10-CM | POA: Diagnosis not present

## 2023-07-14 DIAGNOSIS — L821 Other seborrheic keratosis: Secondary | ICD-10-CM | POA: Diagnosis not present

## 2023-07-14 DIAGNOSIS — L812 Freckles: Secondary | ICD-10-CM | POA: Diagnosis not present

## 2023-07-14 DIAGNOSIS — D1801 Hemangioma of skin and subcutaneous tissue: Secondary | ICD-10-CM | POA: Diagnosis not present

## 2023-08-15 ENCOUNTER — Ambulatory Visit (HOSPITAL_COMMUNITY)
Admission: RE | Admit: 2023-08-15 | Discharge: 2023-08-15 | Disposition: A | Discharge status: Home / Self Care | Source: Ambulatory Visit | Attending: Cardiology | Admitting: Cardiology

## 2023-08-15 DIAGNOSIS — I422 Other hypertrophic cardiomyopathy: Secondary | ICD-10-CM | POA: Diagnosis not present

## 2023-08-15 DIAGNOSIS — I350 Nonrheumatic aortic (valve) stenosis: Secondary | ICD-10-CM | POA: Diagnosis not present

## 2023-08-15 LAB — ECHOCARDIOGRAM COMPLETE
AR max vel: 1.16 cm2
AV Area VTI: 1.39 cm2
AV Area mean vel: 1.32 cm2
AV Mean grad: 19 mmHg
AV Peak grad: 37.9 mmHg
Ao pk vel: 3.08 m/s
Area-P 1/2: 3.66 cm2
P 1/2 time: 605 ms
S' Lateral: 2.6 cm

## 2023-08-20 ENCOUNTER — Other Ambulatory Visit: Payer: Self-pay | Admitting: Cardiology

## 2023-08-22 ENCOUNTER — Other Ambulatory Visit: Payer: Self-pay | Admitting: *Deleted

## 2023-08-22 MED ORDER — CHLORTHALIDONE 25 MG PO TABS
25.0000 mg | ORAL_TABLET | Freq: Every day | ORAL | 3 refills | Status: DC
Start: 2023-08-22 — End: 2024-01-05

## 2023-08-25 ENCOUNTER — Other Ambulatory Visit: Payer: Self-pay | Admitting: Cardiology

## 2023-08-25 DIAGNOSIS — I10 Essential (primary) hypertension: Secondary | ICD-10-CM

## 2023-09-13 ENCOUNTER — Other Ambulatory Visit: Payer: Self-pay | Admitting: Cardiology

## 2023-10-12 DIAGNOSIS — L820 Inflamed seborrheic keratosis: Secondary | ICD-10-CM | POA: Diagnosis not present

## 2023-11-16 NOTE — Telephone Encounter (Signed)
 Pt phoned in 03/03/2020,wanting Dr. Ethyl to call something in for burning sore throat and stated he is waiting on COVID test results. Pt called back on 03/04/2020 just as I was addressing w/Dr. Ethyl. Dr. Ethyl had a dvised that he preferred pt to call PCP. Pt stated that he is COVID + and PCP wanted paper work filled out to see him since he had not been seen since 2011 and that he also had a heart problem and was worried about strept throat causing heart problems, Dr. Ethyl said for him to go to Urgent Care.

## 2023-12-01 DIAGNOSIS — Z8546 Personal history of malignant neoplasm of prostate: Secondary | ICD-10-CM | POA: Diagnosis not present

## 2023-12-06 DIAGNOSIS — R399 Unspecified symptoms and signs involving the genitourinary system: Secondary | ICD-10-CM | POA: Diagnosis not present

## 2023-12-06 DIAGNOSIS — N5201 Erectile dysfunction due to arterial insufficiency: Secondary | ICD-10-CM | POA: Diagnosis not present

## 2023-12-06 DIAGNOSIS — N200 Calculus of kidney: Secondary | ICD-10-CM | POA: Diagnosis not present

## 2023-12-06 DIAGNOSIS — C61 Malignant neoplasm of prostate: Secondary | ICD-10-CM | POA: Diagnosis not present

## 2024-01-02 NOTE — Progress Notes (Unsigned)
 Cardiology Office Note   Date:  01/05/2024   ID:  Lucas Buckley, DOB 1950/12/26, MRN [id number]  PCP:  Gib Charleston, MD  Cardiologist: Anysha Frappier MD  Chief Complaint  Patient presents with   Coronary Artery Disease      History of Present Illness: Lucas Buckley is a 73 y.o. male who presents for follow up of HCM and CAD.    The patient has a history of known ischemic heart disease.  In 1999 he had stents to his right coronary artery and to his circumflex. The patient has a history of HOCM.  He also has a history of hypercholesterolemia and borderline glucose intolerance and exogenous obesity.  He had a stress Myoview  study in February 2018 showing a small fixed apical lateral defect. No ischemia. EF 44%. Echo was done showing severe concentric LVH. No outflow gradient. Mild AS. EF was normal.   He did undergo surgical repair of incisional hernia in March 2025.   Echo in June showed no significant change in mild to moderate AS.   On follow up today he reports he is doing very well from a cardiac standpoint. He denies any chest pain or SOB. No edema or palpitations. Weight is stable. 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  Past Medical History:  Diagnosis Date   Arthritis    knee and sholders   Cancer (HCC)    basal cell/ left cheek/    PROSTATE   Coronary artery disease    ischemic heart disease-  stents x 2- 1999--LOV with clearance and note Dr Dominick, EKG in [facility]   GERD (gastroesophageal reflux disease)    H/O hiatal hernia    Hypercholesterolemia    Hypertension    PONV (postoperative nausea and vomiting)    Renal stones     Past Surgical History:  Procedure Laterality Date   CARDIAC CATHETERIZATION     1999   CORONARY ANGIOPLASTY     FRACTURE SURGERY  1974   left ankle and lower leg   HERNIA REPAIR     umbilical   LITHOTRIPSY     ROBOT ASSISTED LAPAROSCOPIC RADICAL PROSTATECTOMY  03/29/2011   Procedure: ROBOTIC ASSISTED LAPAROSCOPIC RADICAL  PROSTATECTOMY LEVEL 2;  Surgeon: Noretta Ferrara, MD;  Location: [facility];  Service: Urology;  Laterality: N/A;  BILATERAL PELVIC LYMPHADENECTOMY       testicular     tortion repair in teens   XI ROBOTIC ASSISTED VENTRAL HERNIA N/A 04/15/2023   Procedure: XI ROBOTIC INCISIONAL HERNIA REPAIR WITH MESH, BILATERAL POSTERIOR RECTUS MYOFASCIAL RELEASE;  Surgeon: Lyndel Deward PARAS, MD;  Location: [facility];  Service: General;  Laterality: N/A;     Current Outpatient Medications  Medication Sig Dispense Refill   alum hydroxide-mag trisilicate (GAVISCON) 80-20 MG CHEW chewable tablet Chew 2 tablets by mouth at bedtime as needed for indigestion or heartburn.     aspirin  EC 81 MG tablet Take 162 mg by mouth daily.     metoprolol  succinate (TOPROL -XL) 100 MG 24 hr tablet TAKE 1 TABLET(100 MG) BY MOUTH DAILY 90 tablet 3   Multiple Vitamin (MULTIVITAMIN WITH MINERALS) TABS tablet Take 1 tablet by mouth daily.     nitroGLYCERIN  (NITROSTAT ) 0.4 MG SL tablet Place 1 tablet (0.4 mg total) under the tongue every 5 (five) minutes as needed for chest pain. 25 tablet prn   rosuvastatin  (CRESTOR ) 40 MG tablet TAKE 1 TABLET(40 MG) BY MOUTH DAILY 90 tablet 3   tadalafil (CIALIS) 5 MG tablet Take  5 mg by mouth daily.     valsartan  (DIOVAN ) 160 MG tablet TAKE 1 TABLET(160 MG) BY MOUTH DAILY 90 tablet 3   chlorthalidone  (HYGROTON ) 25 MG tablet Take 0.5 tablets (12.5 mg total) by mouth daily. TAKE 1 TABLET BY MOUTH EVERY DAY. 90 tablet 3   No current facility-administered medications for this visit.    Allergies:   Patient has no known allergies.    Social History:  The patient  reports that he has never smoked. He has never used smokeless tobacco. He reports that he does not drink alcohol and does not use drugs.   Family History:  The patient's family history includes Heart failure in his mother; Stroke in his father.    ROS:  Please see the history of present illness.   Otherwise, review of systems are positive for  none.   All other systems are reviewed and negative.    PHYSICAL EXAM: VS:  BP (!) 100/54 (BP Location: Right Arm)   Pulse 75   Ht 6' 1 (1.854 m)   Wt 247 lb 9.6 oz (112.3 kg)   SpO2 98%   BMI 32.67 kg/m  , BMI Body mass index is 32.67 kg/m. GENERAL:  Well appearing obese WM in NAD HEENT:  PERRL, EOMI, sclera are clear. Oropharynx is clear. NECK:  No jugular venous distention, carotid upstroke brisk and symmetric, no bruits, no thyromegaly or adenopathy LUNGS:  Clear to auscultation bilaterally CHEST:  Unremarkable HEART:  RRR,  PMI not displaced or sustained,S1 and S2 within normal limits, no S3, no S4: no clicks, no rubs, 2/6 SEM across precordium.  ABD:  Soft, nontender. BS +, no masses or bruits. No hepatomegaly, no splenomegaly EXT:  2 + pulses throughout, no edema, no cyanosis no clubbing SKIN:  Warm and dry.  No rashes NEURO:  Alert and oriented x 3. Cranial nerves II through XII intact. PSYCH:  Cognitively intact      Lab Results  Component Value Date   WBC 6.7 04/15/2023   HGB 13.4 04/15/2023   HCT 40.5 04/15/2023   PLT 100 (L) 04/15/2023   GLUCOSE 118 (H) 07/05/2023   CHOL 155 07/05/2023   TRIG 150 (H) 07/05/2023   HDL 47 07/05/2023   LDLCALC 82 07/05/2023   ALT 26 07/05/2023   AST 42 (H) 07/05/2023   NA 140 07/05/2023   K 4.1 07/05/2023   CL 104 07/05/2023   CREATININE 1.23 07/05/2023   BUN 23 07/05/2023   CO2 22 07/05/2023   TSH 4.010 07/05/2023   HGBA1C 6.7 (H) 04/05/2023   Dated 04/17/21: A1c 7.8%. creatinine 1.39. BUN 32. Glucose 127, cholesterol 138, trig 151, HDL 40, LDL 72.  Wt Readings from Last 3 Encounters:  01/05/24 247 lb 9.6 oz (112.3 kg)  07/05/23 244 lb (110.7 kg)  04/15/23 240 lb (108.9 kg)     Echo 04/20/16: Study Conclusions   - Left ventricle: The cavity size was normal. There was severe   concentric hypertrophy. Systolic function was normal. The   estimated ejection fraction was in the range of 55% to 60%. Wall   motion was  normal; there were no regional wall motion   abnormalities. Doppler parameters are consistent with a   reversible restrictive pattern, indicative of decreased left   ventricular diastolic compliance and/or increased left atrial   pressure (grade 3 diastolic dysfunction). Doppler parameters are   consistent with high ventricular filling pressure. - Aortic valve: Valve mobility was restricted. There was mild  stenosis. There was no regurgitation. Peak velocity (S): 265   cm/s. Mean gradient (S): 14 mm Hg. Peak gradient (S): 28 mm Hg. - Mitral valve: Transvalvular velocity was within the normal range.   There was no evidence for stenosis. There was trivial   regurgitation. - Right ventricle: The cavity size was normal. Wall thickness was   normal. Systolic function was normal. - Atrial septum: No defect or patent foramen ovale was identified   by color flow Doppler. - Tricuspid valve: There was no regurgitation.   Myoview  04/14/16: Study Highlights     The left ventricular ejection fraction is moderately decreased (30-44%). Nuclear stress EF: 44%. There was no ST segment deviation noted during stress. Blood pressure demonstrated a hypertensive response to exercise. This is an intermediate risk study.   1. EF 44%, diffuse hypokinesis.  2. Fixed, small, mild apical lateral perfusion defect.  Possible attenuation, but cannot rule out small area of infarction.  No ischemia.  3. Intermediate risk study due to low EF.   Echo 08/15/23:  IMPRESSIONS     1. Maximal lateral wall thickness 17 mm; no LVOT gradient seen at rest;  no provocation performed. Left ventricular ejection fraction, by  estimation, is 70 to 75%. The left ventricle has hyperdynamic function.  The left ventricle has no regional wall  motion abnormalities. There is severe concentric left ventricular  hypertrophy. Left ventricular diastolic parameters are indeterminate.   2. Right ventricular systolic function is  hyperdynamic. The right  ventricular size is normal.   3. Left atrial size was mildly dilated.   4. The mitral valve is normal in structure. Mild mitral valve  regurgitation. No evidence of mitral stenosis.   5. The aortic valve was not well visualized. Aortic valve regurgitation  is mild. Mild to moderate aortic valve stenosis. Aortic valve mean  gradient measures 19.0 mmHg. Aortic valve Vmax measures 3.08 m/s.   6. The inferior vena cava is normal in size with greater than 50%  respiratory variability, suggesting right atrial pressure of 3 mmHg.   Comparison(s): Unable to view 2018 study.    ASSESSMENT AND PLAN:  1.  HCM.  Echo  showed severe concentric LVH. No outflow gradient. He is asymptomatic.  Will continue Toprol .  2.  CAD status post stents to right coronary artery and circumflex in 1999. Myoview  in February 2018 showed no ischemia.  No active angina. Continue ASA  3.  Hypercholesterolemia. On statin. Last LDL 82. Dietary modification  4.  Obesity, encourage weight loss   5.  History of prostate cancer treated with robotic surgery with Dr. Renda  6.  HTN  BP controlled on valsartan , chlorthalidone , and Toprol . BP is low today. Will reduce chlorthalidone  by half  7. Glucose intolerance/DM.  8. CKD stage 3a.   Current medicines are reviewed at length with the patient today.  The patient does not have concerns regarding medicines.  The following changes have been made: see above.  Labs/ tests ordered today include:    CMET, Lipids, TSH  Echo   Disposition: follow up in 6 months  Signed, Valda Christenson MD, [facility]   01/05/2024 11:20 AM     [facility]

## 2024-01-05 ENCOUNTER — Ambulatory Visit: Attending: Cardiology | Admitting: Cardiology

## 2024-01-05 ENCOUNTER — Encounter: Payer: Self-pay | Admitting: Cardiology

## 2024-01-05 VITALS — BP 100/54 | HR 75 | Ht 73.0 in | Wt 247.6 lb

## 2024-01-05 DIAGNOSIS — I10 Essential (primary) hypertension: Secondary | ICD-10-CM | POA: Diagnosis not present

## 2024-01-05 DIAGNOSIS — I2510 Atherosclerotic heart disease of native coronary artery without angina pectoris: Secondary | ICD-10-CM

## 2024-01-05 DIAGNOSIS — I350 Nonrheumatic aortic (valve) stenosis: Secondary | ICD-10-CM

## 2024-01-05 DIAGNOSIS — I422 Other hypertrophic cardiomyopathy: Secondary | ICD-10-CM | POA: Diagnosis not present

## 2024-01-05 MED ORDER — CHLORTHALIDONE 25 MG PO TABS: 12.5000 mg | ORAL_TABLET | Freq: Every day | ORAL | 3 refills | Status: AC

## 2024-01-05 NOTE — Patient Instructions (Signed)
 Medication Instructions:  Take Chlorthalidone  25 mg every other day Continue all other medications *If you need a refill on your cardiac medications before your next appointment, please call your pharmacy*  Lab Work: None ordered  Testing/Procedures: None ordered  Follow-Up: At [facility], you and your health needs are our priority.  As part of our continuing mission to provide you with exceptional heart care, our providers are all part of one team.  This team includes your primary Cardiologist (physician) and Advanced Practice Providers or APPs (Physician Assistants and Nurse Practitioners) who all work together to provide you with the care you need, when you need it.  Your next appointment:  6 months   Call in Feb to schedule May appointment     Provider:  Dr.Jordan    We recommend signing up for the patient portal called MyChart.  Sign up information is provided on this After Visit Summary.  MyChart is used to connect with patients for Virtual Visits (Telemedicine).  Patients are able to view lab/test results, encounter notes, upcoming appointments, etc.  Non-urgent messages can be sent to your provider as well.   To learn more about what you can do with MyChart, go to [email or web address].
